# Patient Record
Sex: Female | Born: 1965
Health system: Southern US, Community
[De-identification: ages and names within clinical notes are randomized; demographics above are authoritative.]

## PROBLEM LIST (undated history)

## (undated) DIAGNOSIS — K219 Gastro-esophageal reflux disease without esophagitis: Secondary | ICD-10-CM

## (undated) HISTORY — PX: POLYPECTOMY: SHX149

## (undated) HISTORY — PX: COLONOSCOPY: SHX174

---

## 2004-11-13 ENCOUNTER — Ambulatory Visit (HOSPITAL_COMMUNITY): Admission: RE | Admit: 2004-11-13 | Discharge: 2004-11-13 | Payer: Self-pay | Admitting: Obstetrics and Gynecology

## 2010-05-30 ENCOUNTER — Other Ambulatory Visit (HOSPITAL_COMMUNITY): Payer: Self-pay | Admitting: Obstetrics and Gynecology

## 2010-05-30 DIAGNOSIS — Z1231 Encounter for screening mammogram for malignant neoplasm of breast: Secondary | ICD-10-CM

## 2010-06-10 ENCOUNTER — Ambulatory Visit (HOSPITAL_COMMUNITY)
Admission: RE | Admit: 2010-06-10 | Discharge: 2010-06-10 | Disposition: A | Discharge status: Home / Self Care | Payer: Commercial Managed Care - PPO | Source: Ambulatory Visit | Attending: Obstetrics and Gynecology | Admitting: Obstetrics and Gynecology

## 2010-06-10 ENCOUNTER — Other Ambulatory Visit: Payer: Self-pay | Admitting: Obstetrics and Gynecology

## 2010-06-10 DIAGNOSIS — N644 Mastodynia: Secondary | ICD-10-CM

## 2010-06-10 DIAGNOSIS — Z1231 Encounter for screening mammogram for malignant neoplasm of breast: Secondary | ICD-10-CM

## 2010-06-13 ENCOUNTER — Other Ambulatory Visit: Payer: Commercial Managed Care - PPO

## 2010-06-17 ENCOUNTER — Ambulatory Visit
Admission: RE | Admit: 2010-06-17 | Discharge: 2010-06-17 | Disposition: A | Discharge status: Home / Self Care | Payer: Commercial Managed Care - PPO | Source: Ambulatory Visit | Attending: Obstetrics and Gynecology | Admitting: Obstetrics and Gynecology

## 2010-06-17 DIAGNOSIS — N644 Mastodynia: Secondary | ICD-10-CM

## 2011-12-08 ENCOUNTER — Other Ambulatory Visit (HOSPITAL_COMMUNITY): Payer: Self-pay | Admitting: Obstetrics and Gynecology

## 2011-12-08 DIAGNOSIS — Z1231 Encounter for screening mammogram for malignant neoplasm of breast: Secondary | ICD-10-CM

## 2011-12-10 ENCOUNTER — Encounter: Payer: Self-pay | Admitting: Family Medicine

## 2011-12-10 ENCOUNTER — Ambulatory Visit (INDEPENDENT_AMBULATORY_CARE_PROVIDER_SITE_OTHER): Payer: Commercial Managed Care - PPO | Admitting: Family Medicine

## 2011-12-10 VITALS — BP 110/70 | Temp 98.2°F | Ht 62.0 in | Wt 193.0 lb

## 2011-12-10 DIAGNOSIS — Q393 Congenital stenosis and stricture of esophagus: Secondary | ICD-10-CM

## 2011-12-10 DIAGNOSIS — R5381 Other malaise: Secondary | ICD-10-CM

## 2011-12-10 DIAGNOSIS — Z8371 Family history of colonic polyps: Secondary | ICD-10-CM | POA: Insufficient documentation

## 2011-12-10 DIAGNOSIS — R5383 Other fatigue: Secondary | ICD-10-CM | POA: Insufficient documentation

## 2011-12-10 DIAGNOSIS — R413 Other amnesia: Secondary | ICD-10-CM | POA: Insufficient documentation

## 2011-12-10 DIAGNOSIS — Q391 Atresia of esophagus with tracheo-esophageal fistula: Secondary | ICD-10-CM

## 2011-12-10 DIAGNOSIS — K219 Gastro-esophageal reflux disease without esophagitis: Secondary | ICD-10-CM

## 2011-12-10 DIAGNOSIS — Q394 Esophageal web: Secondary | ICD-10-CM | POA: Insufficient documentation

## 2011-12-10 DIAGNOSIS — R109 Unspecified abdominal pain: Secondary | ICD-10-CM | POA: Insufficient documentation

## 2011-12-10 DIAGNOSIS — J301 Allergic rhinitis due to pollen: Secondary | ICD-10-CM | POA: Insufficient documentation

## 2011-12-10 LAB — BASIC METABOLIC PANEL
BUN: 11 mg/dL (ref 6–23)
Sodium: 136 mEq/L (ref 135–145)

## 2011-12-10 LAB — TSH: TSH: 1.45 u[IU]/mL (ref 0.35–5.50)

## 2011-12-10 LAB — LIPID PANEL
HDL: 49.3 mg/dL (ref >39.00)
LDL Cholesterol: 117 mg/dL — ABNORMAL HIGH (ref 0–99)

## 2011-12-10 LAB — CBC WITH DIFFERENTIAL/PLATELET
Basophils Absolute: 0 10*3/uL (ref 0.0–0.1)
Eosinophils Relative: 2.5 % (ref 0.0–5.0)
Hemoglobin: 13.2 g/dL (ref 12.0–15.0)
Lymphocytes Relative: 23.9 % (ref 12.0–46.0)
Lymphs Abs: 1.7 10*3/uL (ref 0.7–4.0)
MCHC: 33.1 g/dL (ref 30.0–36.0)
MCV: 92.1 fl (ref 78.0–100.0)
Monocytes Relative: 7.3 % (ref 3.0–12.0)
Platelets: 299 10*3/uL (ref 150.0–400.0)
RBC: 4.33 Mil/uL (ref 3.87–5.11)
WBC: 7 10*3/uL (ref 4.5–10.5)

## 2011-12-10 LAB — POCT URINALYSIS DIPSTICK
Bilirubin, UA: NEGATIVE
Leukocytes, UA: NEGATIVE
Protein, UA: NEGATIVE
Spec Grav, UA: 1.015
Urobilinogen, UA: 0.2

## 2011-12-10 LAB — HEPATIC FUNCTION PANEL
ALT: 15 U/L (ref 0–35)
AST: 18 U/L (ref 0–37)
Alkaline Phosphatase: 56 U/L (ref 39–117)
Bilirubin, Direct: 0.1 mg/dL (ref 0.0–0.3)
Total Bilirubin: 0.7 mg/dL (ref 0.3–1.2)
Total Protein: 7.2 g/dL (ref 6.0–8.3)

## 2011-12-10 LAB — T4, FREE: Free T4: 0.78 ng/dL (ref 0.60–1.60)

## 2011-12-10 LAB — T3, FREE: T3, Free: 2.5 pg/mL (ref 2.3–4.2)

## 2011-12-10 MED ORDER — ESOMEPRAZOLE MAGNESIUM 40 MG PO CPDR: 40.0000 mg | DELAYED_RELEASE_CAPSULE | Freq: Every day | ORAL | Status: DC

## 2011-12-10 NOTE — Patient Instructions (Signed)
Continue the Nexium  I will call you when I gets her lab work back  We will set you up for an ultrasound of your gallbladder  I would recommend you a colonoscopy before age 46 because of the positive family history of colon polyps

## 2011-12-10 NOTE — Progress Notes (Signed)
  Subjective:    Patient ID: Heidi Murphy, female    DOB: 1966/02/19, 46 y.o.   MRN: [id number]  HPI Heidi Murphy is a 46 year old married female nonsmoker G2 P2 who has a Mirena IUD which is checked by her GYN doctor ,,,,,,,,, who comes in today for evaluation of fatigue some memory changes weight gain mood changes  She states that she felt well until about 3 months ago when she began having symptoms of fatigue no energy she's gained about 7 pounds also some short-term memory changes. No family history of thyroid disease.Marland Kitchen She's also had symptoms of allergic rhinitis for which she's taken some over-the-counter Allegra which helped somewhat with the allergy symptoms of head congestion.  She's had a long history of reflux esophagitis and about 20 years ago had a barium swallow which documented an esophageal web. About a year ago her symptoms flared up and she went and saw a gastroenterologist who prescribed snack see him. If she takes it everyday which she usually does her symptoms are minimal however despite taking the Nexium she still has an occasional episode of difficulty swallowing. She also has intermittent episodes of midepigastric abdominal discomfort some of which she's describes as a sensation of bloating. They last for a day or 2 and go away. Her mother had gallbladder disease. Heidi Murphy has light skin and light eyes she's 46 has had 2 kids and a family history of gallbladder disease  Review of systems otherwise negative she does BSE monthly and gets annual mammography.  Her father's had a history of skin cancer hypertension hyperlipidemia elevated PSA and colon polyps. Her mother has had gallbladder disease reflux esophagitis and hiatal hernia. No sisters one brother in good health  Tetanus 2010   Review of Systems    total review of systems otherwise negative except for above Objective:   Physical Exam Well-developed well-nourished female in acute distress she has light skin and light eyes HEENT  were negative. Thyroid normal lungs are clear to auscultation cardiac exam normal abdominal exam normal skin exam normal  Neurologic exam she is oriented x3 cranial nerves II through XII are intact her sensation and strength reflexes gait all normal       Assessment & Plan:  Symptoms of fatigue with some memory changes and weight gain rule out thyroid disease check labs  Reflux esophagitis on Nexium we'll recommend a GI consult  Symptoms consistent with possible gallbladder disease set her up for an ultrasound of her gallbladder  Family history of colon polyps recommend she have a colonoscopy prior to age 33  Allergic rhinitis continue Allegra

## 2011-12-14 ENCOUNTER — Ambulatory Visit
Admission: RE | Admit: 2011-12-14 | Discharge: 2011-12-14 | Disposition: A | Discharge status: Home / Self Care | Payer: Commercial Managed Care - PPO | Source: Ambulatory Visit | Attending: Family Medicine | Admitting: Family Medicine

## 2011-12-14 DIAGNOSIS — R109 Unspecified abdominal pain: Secondary | ICD-10-CM

## 2011-12-18 ENCOUNTER — Ambulatory Visit (HOSPITAL_COMMUNITY)
Admission: RE | Admit: 2011-12-18 | Discharge: 2011-12-18 | Disposition: A | Discharge status: Home / Self Care | Payer: 59 | Source: Ambulatory Visit | Attending: Obstetrics and Gynecology | Admitting: Obstetrics and Gynecology

## 2011-12-18 DIAGNOSIS — Z1231 Encounter for screening mammogram for malignant neoplasm of breast: Secondary | ICD-10-CM | POA: Insufficient documentation

## 2012-07-19 ENCOUNTER — Other Ambulatory Visit: Payer: Self-pay | Admitting: Family Medicine

## 2012-07-19 DIAGNOSIS — R1032 Left lower quadrant pain: Secondary | ICD-10-CM

## 2012-07-26 ENCOUNTER — Encounter: Payer: Self-pay | Admitting: Internal Medicine

## 2012-08-16 ENCOUNTER — Encounter: Payer: Self-pay | Admitting: Internal Medicine

## 2012-08-19 ENCOUNTER — Encounter: Payer: Self-pay | Admitting: Internal Medicine

## 2012-08-19 ENCOUNTER — Ambulatory Visit (INDEPENDENT_AMBULATORY_CARE_PROVIDER_SITE_OTHER): Payer: 59 | Admitting: Internal Medicine

## 2012-08-19 ENCOUNTER — Other Ambulatory Visit (INDEPENDENT_AMBULATORY_CARE_PROVIDER_SITE_OTHER): Payer: 59

## 2012-08-19 VITALS — BP 112/78 | HR 64 | Ht 62.0 in | Wt 198.6 lb

## 2012-08-19 DIAGNOSIS — R194 Change in bowel habit: Secondary | ICD-10-CM

## 2012-08-19 DIAGNOSIS — R1314 Dysphagia, pharyngoesophageal phase: Secondary | ICD-10-CM

## 2012-08-19 DIAGNOSIS — R1310 Dysphagia, unspecified: Secondary | ICD-10-CM

## 2012-08-19 DIAGNOSIS — K219 Gastro-esophageal reflux disease without esophagitis: Secondary | ICD-10-CM

## 2012-08-19 DIAGNOSIS — R198 Other specified symptoms and signs involving the digestive system and abdomen: Secondary | ICD-10-CM

## 2012-08-19 DIAGNOSIS — K625 Hemorrhage of anus and rectum: Secondary | ICD-10-CM

## 2012-08-19 LAB — CBC
HCT: 39.2 % (ref 36.0–46.0)
Platelets: 280 10*3/uL (ref 150.0–400.0)
RBC: 4.45 Mil/uL (ref 3.87–5.11)
RDW: 14.3 % (ref 11.5–14.6)
WBC: 7.2 10*3/uL (ref 4.5–10.5)

## 2012-08-19 LAB — COMPREHENSIVE METABOLIC PANEL
BUN: 11 mg/dL (ref 6–23)
CO2: 28 mEq/L (ref 19–32)
Calcium: 8.9 mg/dL (ref 8.4–10.5)
GFR: 76.4 mL/min (ref >60.00)
Sodium: 139 mEq/L (ref 135–145)

## 2012-08-19 MED ORDER — PEG-KCL-NACL-NASULF-NA ASC-C 100 G PO SOLR: 1.0000 | Freq: Once | ORAL | Status: DC

## 2012-08-19 MED ORDER — ESOMEPRAZOLE MAGNESIUM 40 MG PO PACK: 40.0000 mg | PACK | Freq: Every day | ORAL | Status: DC

## 2012-08-19 NOTE — Patient Instructions (Addendum)
You have been scheduled for a colonoscopy/Endoscopy with propofol. Please follow written instructions given to you at your visit today.  Please pick up your prep kit at the pharmacy within the next 1-3 days. If you use inhalers (even only as needed), please bring them with you on the day of your procedure. Your physician has requested that you go to www.startemmi.com and enter the access code given to you at your visit today. This web site gives a general overview about your procedure. However, you should still follow specific instructions given to you by our office regarding your preparation for the procedure.  Your physician has requested that you go to the basement for the following lab work before leaving today: Celiac panel, CBC, CMP  We have sent the following medications to your pharmacy for you to pick up at your convenience: Nexium, moviprep, you were given instructions today at your office visit.                                               We are excited to introduce MyChart, a new best-in-class service that provides you online access to important information in your electronic medical record. We want to make it easier for you to view your health information - all in one secure location - when and where you need it. We expect MyChart will enhance the quality of care and service we provide.  When you register for MyChart, you can:    View your test results.    Request appointments and receive appointment reminders via email.    Request medication renewals.    View your medical history, allergies, medications and immunizations.    Communicate with your physician's office through a password-protected site.    Conveniently print information such as your medication lists.  To find out if MyChart is right for you, please talk to a member of our clinical staff today. We will gladly answer your questions about this free health and wellness tool.  If you are age 46 or older and want a  member of your family to have access to your record, you must provide written consent by completing a proxy form available at our office. Please speak to our clinical staff about guidelines regarding accounts for patients younger than age 41.  As you activate your MyChart account and need any technical assistance, please call the MyChart technical support line at [phone]) or email your question to [email or web address]. If you email your question(s), please include your name, a return phone number and the best time to reach you.  If you have non-urgent health-related questions, you can send a message to our office through MyChart at [email or web address]. If you have a medical emergency, call 911.  Thank you for using MyChart as your new health and wellness resource!   MyChart licensed from [employer],  4540-9811. Patents Pending.

## 2012-08-19 NOTE — Progress Notes (Signed)
Patient ID: Heidi Murphy, female   DOB: 01/26/1966, 47 y.o.   MRN: [id number] HPI: Heidi Murphy is a 47 year old female with a past medical history of GERD who is seen in consultation at the request of Dr. Tawanna Cooler for evaluation of chronic GERD, intermittent dysphagia, rectal bleeding and change in bowel habits. She is alone today. She reports 20 years of reflux disease which initially was intermittent. She recalls a remote barium swallow which revealed an esophageal wet. She has never had an endoscopy. She has taken PPI, initially with Nexium which seemed to help significantly. She also reports there are times when she was not taking acid suppressive medications, and was able to control her symptoms with portion control and dietary modification. Her GERD has been fairly consistent, but her dysphagia more intermittent. She recalls having esophageal dysphagia 1 year ago which seemed to respond and improve with PPI. She still reports intermittent difficulty swallowing particularly solid foods such as breads and meats. She recalls one occasion of a food impaction but she was able to vomit this bolus backup. No odynophagia. She reports intermittent epigastric pain. She also reports an ultrasound which was negative for gallbladder disease or stones. Over the last 6-12 months she's had a change in her bowel habits. She reports loose stools occasionally purulent liquid with mucus. She also is seeing dark red blood mixed with her stool 1 day per week. She reports her stools are more urgent now with some lower abdominal cramping. She reports a constant left lower quadrant pain which she describes as a "dull ache". She also endorses bloating and fullness. No fevers or chills. No mouth ulcers, no rashes, or new joint pains. No family history of IBD or celiac disease. Her father had colonic polyps. No weight loss in fact about 10 pound weight gain in the past 12 months. She has never had a colonoscopy.  She is currently taking  pantoprazole 40 mg on a consistent basis and rarely has breakthrough heartburn. She feels that Nexium worked considerably better for her reflux symptoms including dysphagia.  Patient Active Problem List   Diagnosis Date Noted  . Allergic rhinitis due to pollen 12/10/2011  . Intermittent memory loss 12/10/2011  . Esophageal web 12/10/2011  . GERD (gastroesophageal reflux disease) 12/10/2011  . Family history of colonic polyps 12/10/2011  . Abdominal pain 12/10/2011  . Fatigue 12/10/2011    Past Surgical History  Procedure Laterality Date  . Cesarean section      Current Outpatient Prescriptions  Medication Sig Dispense Refill  . esomeprazole (NEXIUM) 40 MG packet Take 40 mg by mouth daily before breakfast.  30 each  12  . peg 3350 powder (MOVIPREP) 100 G SOLR Take 1 kit (100 g total) by mouth once.  1 kit  0   No current facility-administered medications for this visit.    No Known Allergies  Family History  Problem Relation Age of Onset  . Prostate cancer Father   . Colon polyps Father   . GER disease Mother     GERD    History  Substance Use Topics  . Smoking status: Never Smoker   . Smokeless tobacco: Never Used  . Alcohol Use: No    ROS: As per history of present illness, otherwise negative  BP 112/78  Pulse 64  Ht 5\' 2"  (1.575 m)  Wt 198 lb 9.6 oz (90.084 kg)  BMI 36.32 kg/m2 Constitutional: Well-developed and well-nourished. No distress. HEENT: Normocephalic and atraumatic. Oropharynx is clear and moist. No  oropharyngeal exudate. Conjunctivae are normal.  No scleral icterus. Neck: Neck supple. Trachea midline. Cardiovascular: Normal rate, regular rhythm and intact distal pulses. No M/R/G Pulmonary/chest: Effort normal and breath sounds normal. No wheezing, rales or rhonchi. Abdominal: Soft, mild tenderness to deep palpation in the left midabdomen just lateral to the umbilicus, nondistended. Bowel sounds active throughout. There are no masses palpable. No  hepatosplenomegaly. Extremities: no clubbing, cyanosis, or edema Lymphadenopathy: No cervical adenopathy noted. Neurological: Alert and oriented to person place and time. Skin: Skin is warm and dry. No rashes noted. Psychiatric: Normal mood and affect. Behavior is normal.  RELEVANT LABS AND IMAGING: CBC    Component Value Date/Time   WBC 7.0 12/10/2011 1334   RBC 4.33 12/10/2011 1334   HGB 13.2 12/10/2011 1334   HCT 39.8 12/10/2011 1334   PLT 299.0 12/10/2011 1334   MCV 92.1 12/10/2011 1334   MCHC 33.1 12/10/2011 1334   RDW 13.7 12/10/2011 1334   LYMPHSABS 1.7 12/10/2011 1334   MONOABS 0.5 12/10/2011 1334   EOSABS 0.2 12/10/2011 1334   BASOSABS 0.0 12/10/2011 1334    CMP     Component Value Date/Time   NA 136 12/10/2011 1334   K 4.1 12/10/2011 1334   CL 102 12/10/2011 1334   CO2 28 12/10/2011 1334   GLUCOSE 97 12/10/2011 1334   BUN 11 12/10/2011 1334   CREATININE 0.8 12/10/2011 1334   CALCIUM 8.8 12/10/2011 1334   PROT 7.2 12/10/2011 1334   ALBUMIN 4.0 12/10/2011 1334   AST 18 12/10/2011 1334   ALT 15 12/10/2011 1334   ALKPHOS 56 12/10/2011 1334   BILITOT 0.7 12/10/2011 1334   LIMITED ABDOMINAL ULTRASOUND - RIGHT UPPER QUADRANT -- Sept 2013   Comparison:  None   Findings:   Gallbladder: No shadowing gallstones or echogenic sludge. No gallbladder wall thickening or pericholecystic fluid. Negative sonographic Murphy's sign according to the ultrasound technologist. Gallbladder wall thickness measured 2.3 mm.   CBD: Normal in caliber measuring approximately 3.7 mm.   Liver:  Normal size and echotexture without focal parenchymal abnormality.   Right Kidney:  No hydronephrosis.   IMPRESSION:   No right upper quadrant abdominal pathology is evident.  ASSESSMENT/PLAN:  47 year old female with a past medical history of GERD who is seen in consultation at the request of Dr. Tawanna Cooler for evaluation of chronic GERD, intermittent dysphagia, rectal bleeding and change in bowel habits.  1.   GERD/dysphagia -- patient has a long-standing history of gastroesophageal reflux disease symptoms and intermittent dysphagia. For this reason I have recommended direct visualization with upper endoscopy. This will help to exclude ongoing esophagitis, a significant ring/web, and eosinophilic esophagitis. For now I will switch her pantoprazole back to Nexium 40 mg daily. I would like to check a celiac panel today given her abdominal complaints. EGD will also be beneficial to visualize the small bowel mucosa.  2.  LLQ pain, change in bowel habits/rectal bleeding -- given the constant nature to her discomfort and benign abdominal exam today, diverticulitis is thought unlikely. I recommended proceeding to colonoscopy for further evaluation. This will help to exclude possible colonic source for bleeding including inflammatory bowel disease. She may benefit from a probiotic, but we will perform procedures before initiating any new therapy.  She has a pending appointment with her OB/GYN physician, which I think is also a great idea. I would like to check a CBC today to ensure there is no anemia with her intermittent bleeding.

## 2012-09-12 ENCOUNTER — Encounter: Payer: Self-pay | Admitting: Internal Medicine

## 2012-09-12 ENCOUNTER — Ambulatory Visit (AMBULATORY_SURGERY_CENTER): Payer: 59 | Admitting: Internal Medicine

## 2012-09-12 VITALS — BP 125/74 | HR 53 | Temp 98.6°F | Resp 14 | Ht 62.0 in | Wt 198.0 lb

## 2012-09-12 DIAGNOSIS — K219 Gastro-esophageal reflux disease without esophagitis: Secondary | ICD-10-CM

## 2012-09-12 DIAGNOSIS — R1314 Dysphagia, pharyngoesophageal phase: Secondary | ICD-10-CM

## 2012-09-12 DIAGNOSIS — K625 Hemorrhage of anus and rectum: Secondary | ICD-10-CM

## 2012-09-12 DIAGNOSIS — R109 Unspecified abdominal pain: Secondary | ICD-10-CM

## 2012-09-12 DIAGNOSIS — R194 Change in bowel habit: Secondary | ICD-10-CM

## 2012-09-12 DIAGNOSIS — R198 Other specified symptoms and signs involving the digestive system and abdomen: Secondary | ICD-10-CM

## 2012-09-12 DIAGNOSIS — R1310 Dysphagia, unspecified: Secondary | ICD-10-CM

## 2012-09-12 DIAGNOSIS — D126 Benign neoplasm of colon, unspecified: Secondary | ICD-10-CM

## 2012-09-12 MED ORDER — SODIUM CHLORIDE 0.9 % IV SOLN: 500.0000 mL | INTRAVENOUS | Status: DC

## 2012-09-12 NOTE — Patient Instructions (Addendum)
YOU HAD AN ENDOSCOPIC PROCEDURE TODAY AT THE [facility]: Refer to the procedure report that was given to you for any specific questions about what was found during the examination.  If the procedure report does not answer your questions, please call your gastroenterologist to clarify.  If you requested that your care partner not be given the details of your procedure findings, then the procedure report has been included in a sealed envelope for you to review at your convenience later.  YOU SHOULD EXPECT: Some feelings of bloating in the abdomen. Passage of more gas than usual.  Walking can help get rid of the air that was put into your GI tract during the procedure and reduce the bloating. If you had a lower endoscopy (such as a colonoscopy or flexible sigmoidoscopy) you may notice spotting of blood in your stool or on the toilet paper. If you underwent a bowel prep for your procedure, then you may not have a normal bowel movement for a few days.  DIET: Your first meal following the procedure should be a light meal and then it is ok to progress to your normal diet.  A half-sandwich or bowl of soup is an example of a good first meal.  Heavy or fried foods are harder to digest and may make you feel nauseous or bloated.  Likewise meals heavy in dairy and vegetables can cause extra gas to form and this can also increase the bloating.  Drink plenty of fluids but you should avoid alcoholic beverages for 24 hours.  ACTIVITY: Your care partner should take you home directly after the procedure.  You should plan to take it easy, moving slowly for the rest of the day.  You can resume normal activity the day after the procedure however you should NOT DRIVE or use heavy machinery for 24 hours (because of the sedation medicines used during the test).    SYMPTOMS TO REPORT IMMEDIATELY: A gastroenterologist can be reached at any hour.  During normal business hours, 8:30 AM to 5:00 PM Monday through Friday,  call [phone].  After hours and on weekends, please call the GI answering service at [phone] -emergency number-who will take a message and have the physician on call contact you.   Following lower endoscopy (colonoscopy or flexible sigmoidoscopy):  Excessive amounts of blood in the stool  Significant tenderness or worsening of abdominal pains  Swelling of the abdomen that is new, acute  Fever of 100F or higher  Following upper endoscopy (EGD)  Vomiting of blood or coffee ground material  New chest pain or pain under the shoulder blades  Painful or persistently difficult swallowing  New shortness of breath  Fever of 100F or higher  Black, tarry-looking stools  FOLLOW UP: If any biopsies were taken you will be contacted by phone or by letter within the next 1-3 weeks.  Call your gastroenterologist if you have not heard about the biopsies in 3 weeks.  Our staff will call the home number listed on your records the next business day following your procedure to check on you and address any questions or concerns that you may have at that time regarding the information given to you following your procedure. This is a courtesy call and so if there is no answer at the home number and we have not heard from you through the emergency physician on call, we will assume that you have returned to your regular daily activities without incident.  SIGNATURES/CONFIDENTIALITY: You and/or your  care partner have signed paperwork which will be entered into your electronic medical record.  These signatures attest to the fact that that the information above on your After Visit Summary has been reviewed and is understood.  Full responsibility of the confidentiality of this discharge information lies with you and/or your care-partner.   Handout on polyps, diverticulosis, high fiber diet, hemorrhoids  Hold aspirin, aspirin products, all anti inflammatory medicines like motrin, aleve, advil for 3-4  weeks. You may use tylenol only for pain and you may resume these medicines on Monday 10-10-12  You should expect to see some bleeding after this procedure.  Dr Rhea Belton would like you to have a flexible sigmoidoscopy in 3 months to check the rectal polypectomy site.  Continue your acid reflux medicine as directed daily. Please take this 20-30 minutes before the first meal of the day.

## 2012-09-12 NOTE — Op Note (Signed)
[facility] [address] [place], [address]   COLONOSCOPY PROCEDURE REPORT  PATIENT: Heidi, Murphy.  MR#: [id number] BIRTHDATE: [birth year] , 47  yrs. old GENDER: Female ENDOSCOPIST: Beverley Fiedler, MD REFERRED IO:[id number] Shawnie Dapper, M.D. PROCEDURE DATE:  09/12/2012 PROCEDURE:   Colonoscopy with snare polypectomy, Colonoscopy with cold biopsy polypectomy, and Submucosal injection, any substance  ASA CLASS:   Class II INDICATIONS:Change in bowel habits, Rectal Bleeding, and abdominal pain in the lower left quadrant. MEDICATIONS: MAC sedation, administered by CRNA and propofol (Diprivan) 500mg  IV  DESCRIPTION OF PROCEDURE:   After the risks benefits and alternatives of the procedure were thoroughly explained, informed consent was obtained.  A digital rectal exam revealed a palpable rectal polyp.   The LB [id number]  endoscope was introduced through the anus and advanced to the terminal ileum which was intubated for a short distance. No adverse events experienced. The quality of the prep was Moviprep fair  The instrument was then slowly withdrawn as the colon was fully examined.    COLON FINDINGS: The mucosa appeared normal in the terminal ileum. A sessile polyp measuring 2.5 cm in size was found in the rectum. Endoscopic mucosal resection was performed in a piecemeal fashion by injecting saline and indigo carmine into the submucosa to raise the lesion and polypectomy was performed with snare cautery.  The resection was incomplete and the remaining polyp tissue was removed with cold forceps.  There was no 'target sign' at EMR site.  There was minimal blood loss from the polypectomy which ceased spontaneously.   Three sessile polyps measuring 4-5 mm in size were found in the descending colon and sigmoid colon.  Polypectomy was performed using cold snare.  All resections were complete and all polyp tissue was completely retrieved.   There was mild  scattered diverticulosis noted in the descending colon and sigmoid colon. Retroflexed views revealed internal hemorrhoids. The time to cecum=4 minutes 13 seconds.  Withdrawal time=35 minutes 27 seconds. The scope was withdrawn and the procedure completed.  COMPLICATIONS: There were no complications.  ENDOSCOPIC IMPRESSION: 1.   Normal mucosa in the terminal ileum 2.   Sessile polyp measuring 2.5 cm in size was found in the rectum; endoscopic mucosal resection was performed 3.   Three sessile polyps measuring 4-5 mm in size were found in the descending colon and sigmoid colon; Polypectomy was performed using cold snare 4.   There was mild diverticulosis noted in the descending colon and sigmoid colon 5.   Small internal hemorrhoids  RECOMMENDATIONS: 1.  Hold aspirin, aspirin products, and anti-inflammatory medication for 2 weeks. 2.  Await pathology results 3.  Repeat examination with flexible sigmoidoscopy in 3 months to ensure complete resection (hospital setting given availability of APC) 4.  High fiber diet 5.  You will receive a letter within 1-2 weeks with the results of your biopsy as well as final recommendations.  Please call my office if you have not received a letter after 3 weeks.   eSigned:  Beverley Fiedler, MD 09/12/2012 2:45 PM   cc: The Patient and Roderick Pee, MD   PATIENT NAME:  Heidi, Murphy. MR#: [id number]

## 2012-09-12 NOTE — Progress Notes (Signed)
Patient did not experience any of the following events: a burn prior to discharge; a fall within the facility; wrong site/side/patient/procedure/implant event; or a hospital transfer or hospital admission upon discharge from the facility. (G8907)Patient did not have preoperative order for IV antibiotic SSI prophylaxis. (G8918) ewm 

## 2012-09-12 NOTE — Op Note (Signed)
[facility] [address] [place], [address]   ENDOSCOPY PROCEDURE REPORT  PATIENT: Heidi, Murphy.  MR#: [id number] BIRTHDATE: 04-Oct-1965 , 47  yrs. old GENDER: Female ENDOSCOPIST: Beverley Fiedler, MD REFERRED BY:  Roderick Pee, M.D. PROCEDURE DATE:  09/12/2012 PROCEDURE:  EGD w/ biopsy ASA CLASS:     Class II INDICATIONS:  history of GERD.   Dysphagia. MEDICATIONS: MAC sedation, administered by CRNA and propofol (Diprivan) 400mg  IV TOPICAL ANESTHETIC: none  DESCRIPTION OF PROCEDURE: After the risks benefits and alternatives of the procedure were thoroughly explained, informed consent was obtained.  The LB [id number] endoscope was introduced through the mouth and advanced to the second portion of the duodenum. Without limitations.  The instrument was slowly withdrawn as the mucosa was fully examined.   ESOPHAGUS: There was LA Class A (mild) reflux esophagitis noted. Multiple biopsies were taken in the distal and mid esophagus to rule out eosinophilic esophagitis.   A 3 cm hiatal hernia was noted.   There was no evidence of stricture  STOMACH: The mucosa of the stomach appeared normal.  Biopsies were taken in the antrum and angularis.  DUODENUM: The duodenal mucosa showed no abnormalities in the bulb and second portion of the duodenum.  Cold forceps biopsies were taken in the bulb and second portion.  Retroflexed views revealed a hiatal hernia.     The scope was then withdrawn from the patient and the procedure completed.  COMPLICATIONS: There were no complications.  ENDOSCOPIC IMPRESSION: 1.   There was LA Class A esophagitis noted 2.   Multiple biopsies were taken in the distal and mid esophagus to rule out eosinophilic esophagitis 3.   3 cm hiatal hernia 4.   The mucosa of the stomach appeared normal; biopsies were taken in the antrum and angularis 5.   The duodenal mucosa showed no abnormalities in the bulb and second portion of the  duodenum  RECOMMENDATIONS: 1.  Await pathology results 2.  Continue taking your PPI (antiacid medicine) once daily.  It is best to be taken 20-30 minutes prior to breakfast meal.  eSigned:  Beverley Fiedler, MD 09/12/2012 2:49 PM          CC:The Patient and Roderick Pee, MD

## 2012-09-12 NOTE — Progress Notes (Signed)
Called to room to assist during endoscopic procedure.  Patient ID and intended procedure confirmed with present staff. Received instructions for my participation in the procedure from the performing physician.  

## 2012-09-13 ENCOUNTER — Telehealth: Payer: Self-pay

## 2012-09-13 NOTE — Telephone Encounter (Signed)
  Follow up Call-  Call back number 09/12/2012  Post procedure Call Back phone  # [phone]  Permission to leave phone message Yes     Patient questions:  Do you have a fever, pain , or abdominal swelling? no Pain Score  0 *  Have you tolerated food without any problems? yes  Have you been able to return to your normal activities? yes  Do you have any questions about your discharge instructions: Diet   no Medications  no Follow up visit  no  Do you have questions or concerns about your Care? no  Actions: * If pain score is 4 or above: No action needed, pain <4.

## 2012-09-20 ENCOUNTER — Encounter: Payer: Self-pay | Admitting: Internal Medicine

## 2012-09-20 ENCOUNTER — Other Ambulatory Visit: Payer: Self-pay | Admitting: *Deleted

## 2012-09-20 ENCOUNTER — Telehealth: Payer: Self-pay | Admitting: *Deleted

## 2012-09-20 DIAGNOSIS — Z8719 Personal history of other diseases of the digestive system: Secondary | ICD-10-CM

## 2012-09-20 NOTE — Telephone Encounter (Signed)
Per Dr Rhea Belton, informed pt that her path came back showing precancerous polyps and the large rectal polyp was also precancerous, but no high grade dysplasia. The rectal polyp's architecture had a villous appearance; this type has a higher risk of cancer. The rectal polyp was removed by piecemeal, so she will need a repeat procedure to recheck the area in 3 months. Dr Rhea Belton will need to do this at the hospital in case special equipment is needed. Her EGD path showed nothing significant other there inflammation. Pt stated understanding. She is having upper GI problems, but has started a probiotic and metamucil and will let us know in a couple of weeks if ineffective.

## 2012-09-20 NOTE — Telephone Encounter (Signed)
Pt has been scheduled for 12/13/12 at 09:30am for her Flex Sig at [facility]. Mailed pt prep instructions requesting her to call for questions.

## 2012-11-14 ENCOUNTER — Encounter (HOSPITAL_COMMUNITY): Payer: Self-pay | Admitting: Pharmacy Technician

## 2012-11-15 ENCOUNTER — Encounter (HOSPITAL_COMMUNITY): Payer: Self-pay | Admitting: *Deleted

## 2012-12-13 ENCOUNTER — Encounter (HOSPITAL_COMMUNITY)
Admission: RE | Disposition: A | Discharge status: Home / Self Care | Payer: Self-pay | Source: Ambulatory Visit | Attending: Internal Medicine

## 2012-12-13 ENCOUNTER — Ambulatory Visit (HOSPITAL_COMMUNITY): Payer: 59 | Admitting: Anesthesiology

## 2012-12-13 ENCOUNTER — Encounter (HOSPITAL_COMMUNITY): Payer: Self-pay | Admitting: *Deleted

## 2012-12-13 ENCOUNTER — Ambulatory Visit (HOSPITAL_COMMUNITY)
Admission: RE | Admit: 2012-12-13 | Discharge: 2012-12-13 | Disposition: A | Discharge status: Home / Self Care | Payer: 59 | Source: Ambulatory Visit | Attending: Internal Medicine | Admitting: Internal Medicine

## 2012-12-13 ENCOUNTER — Encounter (HOSPITAL_COMMUNITY): Payer: Self-pay | Admitting: Anesthesiology

## 2012-12-13 DIAGNOSIS — Z9889 Other specified postprocedural states: Secondary | ICD-10-CM

## 2012-12-13 DIAGNOSIS — Z8719 Personal history of other diseases of the digestive system: Secondary | ICD-10-CM

## 2012-12-13 DIAGNOSIS — Z860100 Personal history of colon polyps, unspecified: Secondary | ICD-10-CM | POA: Insufficient documentation

## 2012-12-13 DIAGNOSIS — Z860101 Personal history of adenomatous and serrated colon polyps: Secondary | ICD-10-CM

## 2012-12-13 DIAGNOSIS — K625 Hemorrhage of anus and rectum: Secondary | ICD-10-CM | POA: Insufficient documentation

## 2012-12-13 DIAGNOSIS — D128 Benign neoplasm of rectum: Secondary | ICD-10-CM | POA: Insufficient documentation

## 2012-12-13 DIAGNOSIS — D126 Benign neoplasm of colon, unspecified: Secondary | ICD-10-CM

## 2012-12-13 DIAGNOSIS — K219 Gastro-esophageal reflux disease without esophagitis: Secondary | ICD-10-CM | POA: Insufficient documentation

## 2012-12-13 DIAGNOSIS — R198 Other specified symptoms and signs involving the digestive system and abdomen: Secondary | ICD-10-CM | POA: Insufficient documentation

## 2012-12-13 HISTORY — DX: Gastro-esophageal reflux disease without esophagitis: K21.9

## 2012-12-13 HISTORY — PX: FLEXIBLE SIGMOIDOSCOPY: SHX5431

## 2012-12-13 HISTORY — PX: HOT HEMOSTASIS: SHX5433

## 2012-12-13 SURGERY — SIGMOIDOSCOPY, FLEXIBLE
Anesthesia: Monitor Anesthesia Care

## 2012-12-13 MED ORDER — LACTATED RINGERS IV SOLN
INTRAVENOUS | Status: DC
  Administered 2012-12-13: 1000 mL via INTRAVENOUS

## 2012-12-13 MED ORDER — SODIUM CHLORIDE 0.9 % IV SOLN: INTRAVENOUS | Status: DC

## 2012-12-13 MED ORDER — PROPOFOL INFUSION 10 MG/ML OPTIME
INTRAVENOUS | Status: DC | PRN
  Administered 2012-12-13: 120 ug/kg/min via INTRAVENOUS

## 2012-12-13 MED ORDER — FENTANYL CITRATE 0.05 MG/ML IJ SOLN: 25.0000 ug | INTRAMUSCULAR | Status: DC | PRN

## 2012-12-13 MED ORDER — KETAMINE HCL 10 MG/ML IJ SOLN
INTRAMUSCULAR | Status: DC | PRN
  Administered 2012-12-13: 10 mg via INTRAVENOUS
  Administered 2012-12-13: 20 mg via INTRAVENOUS
  Administered 2012-12-13: 10 mg via INTRAVENOUS

## 2012-12-13 MED ORDER — MIDAZOLAM HCL 5 MG/5ML IJ SOLN
INTRAMUSCULAR | Status: DC | PRN
  Administered 2012-12-13: 2 mg via INTRAVENOUS

## 2012-12-13 NOTE — Transfer of Care (Signed)
Immediate Anesthesia Transfer of Care Note  Patient: Heidi Murphy  Procedure(s) Performed: Procedure(s): FLEXIBLE SIGMOIDOSCOPY (N/A) HOT HEMOSTASIS (ARGON PLASMA COAGULATION/BICAP) (N/A)  Patient Location: PACU and Endoscopy Unit  Anesthesia Type:MAC  Level of Consciousness: awake, alert , oriented and patient cooperative  Airway & Oxygen Therapy: Patient Spontanous Breathing and Patient connected to face mask oxygen  Post-op Assessment: Report given to PACU RN, Post -op Vital signs reviewed and stable and Patient moving all extremities X 4  Post vital signs: Reviewed and stable  Complications: No apparent anesthesia complications

## 2012-12-13 NOTE — Anesthesia Preprocedure Evaluation (Addendum)
Anesthesia Evaluation  Patient identified by MRN, date of birth, ID band Patient awake    Reviewed: Allergy & Precautions, H&P , NPO status , Patient's Chart, lab work & pertinent test results  Airway Mallampati: II TM Distance: >3 FB Neck ROM: full    Dental  (+) Caps and Dental Advisory Given 2 upper front are capped:   Pulmonary neg pulmonary ROS,  breath sounds clear to auscultation  Pulmonary exam normal       Cardiovascular Exercise Tolerance: Good negative cardio ROS  Rhythm:regular Rate:Normal     Neuro/Psych negative neurological ROS  negative psych ROS   GI/Hepatic negative GI ROS, Neg liver ROS, GERD-  Medicated and Controlled,  Endo/Other  negative endocrine ROS  Renal/GU negative Renal ROS  negative genitourinary   Musculoskeletal   Abdominal   Peds  Hematology negative hematology ROS (+)   Anesthesia Other Findings   Reproductive/Obstetrics negative OB ROS                          Anesthesia Physical Anesthesia Plan  ASA: II  Anesthesia Plan: MAC   Post-op Pain Management:    Induction:   Airway Management Planned:   Additional Equipment:   Intra-op Plan:   Post-operative Plan:   Informed Consent: I have reviewed the patients History and Physical, chart, labs and discussed the procedure including the risks, benefits and alternatives for the proposed anesthesia with the patient or authorized representative who has indicated his/her understanding and acceptance.   Dental Advisory Given  Plan Discussed with: CRNA and Surgeon  Anesthesia Plan Comments:         Anesthesia Quick Evaluation

## 2012-12-13 NOTE — Op Note (Addendum)
[facility] [address] [place], [address]   FLEX SIGMOIDOSCOPY PROCEDURE REPORT  PATIENT: Heidi Murphy, Heidi Murphy.  MR#: [id number] BIRTHDATE: 10/04/1965 , 47  yrs. old GENDER: Female ENDOSCOPIST: Beverley Fiedler, MD PROCEDURE DATE:  12/13/2012 PROCEDURE:   Sigmoidoscopy with biopsy and Sigmoidoscopy with cold snare and cold biopsy polypectomy INDICATIONS:high risk patient with personal history of colonic polyps, piecemeal resection of rectal polyp June 2014. MEDICATIONS: MAC sedation, administered by CRNA and See Anesthesia Report.  DESCRIPTION OF PROCEDURE:    Physical exam was performed.  Informed consent was obtained from the patient after explaining the benefits, risks, and alternatives to procedure.  The patient was connected to monitor and placed in left lateral position. Continuous oxygen was provided by nasal cannula and IV medicine administered through an indwelling cannula.  After administration of sedation and rectal exam, the patients rectum was intubated and the Pentax peds colonoscope [id number] was advanced under direct visualization to the mid sigmoid colon.  The scope was removed slowly by carefully examining the color, texture, anatomy, and integrity mucosa on the way out.  The patient was recovered in endoscopy and discharged home in satisfactory condition.    COLON FINDINGS: A sessile polyp measuring 5 mm in size was found in the rectum. This is felt to be residual polyp tissue from previously resected rectal polyp.  A polypectomy was performed using snare cautery.  The resection was complete and the polyp tissue was completely retrieved.  Multiple biopsies around the polypectomy site were performed using cold forceps to exclude adenomatous change.  A sessile polyp measuring 2 mm in size was found in the distal sigmoid colon.  A polypectomy was performed with cold forceps.  The resection was complete and the polyp tissue was completely retrieved.   Mucosa otherwise normal in examined segment.  PREP QUALITY: good COMPLICATIONS: None  ENDOSCOPIC IMPRESSION: 1.   Sessile polyp measuring 5 mm in size was found in the rectum (most likely residual polyp); polypectomy was performed using snare cautery; multiple biopsies around polypectomy base were performed using cold forceps 2.   Sessile polyp measuring 2 mm in size was found in the distal sigmoid colon; polypectomy was performed with cold forceps 3.   The colon mucosa was otherwise normal in the examined sigmoid and rectum   RECOMMENDATIONS: 1.  Await biopsy results 2.  Repeat surveillance interval to be determined by pathology results  CC: Dr. Kelle Darting, the patient eSigned:  Beverley Fiedler, MD 12/13/2012 10:27 AM  Revised: 12/13/2012 10:27 AM

## 2012-12-13 NOTE — Anesthesia Postprocedure Evaluation (Signed)
  Anesthesia Post-op Note  Patient: Heidi Murphy  Procedure(s) Performed: Procedure(s) (LRB): FLEXIBLE SIGMOIDOSCOPY (N/A) HOT HEMOSTASIS (ARGON PLASMA COAGULATION/BICAP) (N/A)  Patient Location: PACU  Anesthesia Type: MAC  Level of Consciousness: awake and alert   Airway and Oxygen Therapy: Patient Spontanous Breathing  Post-op Pain: mild  Post-op Assessment: Post-op Vital signs reviewed, Patient's Cardiovascular Status Stable, Respiratory Function Stable, Patent Airway and No signs of Nausea or vomiting  Last Vitals:  Filed Vitals:   12/13/12 1022  BP:   Pulse:   Temp: 36.5 C  Resp:     Post-op Vital Signs: stable   Complications: No apparent anesthesia complications

## 2012-12-13 NOTE — H&P (Signed)
HPI: 47 year old female with a past medical history of GERD who was seen in May 2014 to evaluate chronic GERD, rectal bleeding and change in bowel habits. She had a colonoscopy on 09/12/2012 which revealed a 2.5 cm sessile rectal polyp. Endoscopic mucosal resection was performed in piecemeal fashion. She also had 3 other adenomatous polyps which were removed from the descending and sigmoid colon.  A repeat flexible sigmoidoscopy was recommended in 3 months to ensure complete resection of her large rectal polyp. She returns today for this her seizure. She reports she is doing well without specific complaint. She denies rectal bleeding.  She is using Nexium and a probiotic daily which she reports has helped her previous UGI complaints.  Past Medical History  Diagnosis Date  . GERD (gastroesophageal reflux disease)     Past Surgical History  Procedure Laterality Date  . Cesarean section  2001 AND 2003    Current Facility-Administered Medications  Medication Dose Route Frequency Provider Last Rate Last Dose  . 0.9 %  sodium chloride infusion   Intravenous Continuous Beverley Fiedler, MD      . fentaNYL (SUBLIMAZE) injection 25-50 mcg  25-50 mcg Intravenous Q5 min PRN Gaetano Hawthorne, MD      . lactated ringers infusion   Intravenous Continuous Gaetano Hawthorne, MD 125 mL/hr at 12/13/12 0925 1,000 mL at 12/13/12 0925    No Known Allergies  Family History  Problem Relation Age of Onset  . Prostate cancer Father   . Colon polyps Father   . GER disease Mother     GERD    History  Substance Use Topics  . Smoking status: Never Smoker   . Smokeless tobacco: Never Used  . Alcohol Use: No    ROS: As per history of present illness, otherwise negative  BP 109/68  Pulse 62  Temp(Src) 97.9 F (36.6 C) (Oral)  Resp 19  Ht 5\' 2"  (1.575 m)  Wt 198 lb (89.812 kg)  BMI 36.21 kg/m2  SpO2 98% Gen: awake, alert, NAD HEENT: anicteric, op clear CV: RRR, no mrg Pulm: CTA b/l Abd: soft, NT/ND, +BS  throughout Ext: no c/c/e Neuro: nonfocal   RELEVANT LABS AND IMAGING: CBC    Component Value Date/Time   WBC 7.2 08/19/2012 1419   RBC 4.45 08/19/2012 1419   HGB 13.3 08/19/2012 1419   HCT 39.2 08/19/2012 1419   PLT 280.0 08/19/2012 1419   MCV 87.9 08/19/2012 1419   MCHC 34.0 08/19/2012 1419   RDW 14.3 08/19/2012 1419   LYMPHSABS 1.7 12/10/2011 1334   MONOABS 0.5 12/10/2011 1334   EOSABS 0.2 12/10/2011 1334   BASOSABS 0.0 12/10/2011 1334    CMP     Component Value Date/Time   NA 139 08/19/2012 1419   K 4.3 08/19/2012 1419   CL 106 08/19/2012 1419   CO2 28 08/19/2012 1419   GLUCOSE 95 08/19/2012 1419   BUN 11 08/19/2012 1419   CREATININE 0.9 08/19/2012 1419   CALCIUM 8.9 08/19/2012 1419   PROT 7.2 08/19/2012 1419   ALBUMIN 3.8 08/19/2012 1419   AST 16 08/19/2012 1419   ALT 17 08/19/2012 1419   ALKPHOS 56 08/19/2012 1419   BILITOT 0.7 08/19/2012 1419    ASSESSMENT/PLAN: 47 year old with adenomatous colon polyps, one 2.5 cm rectal polyp removed in piecemeal fashion in June 2014 he returns for flexible sigmoidoscopy to ensure complete resection  1.  Adenomatous rectal polyp -- she is here today for a flexible sigmoidoscopy to evaluate the polypectomy  site in the rectum. If residual polyp remains, we will again attempt to remove it. The nature of the procedure, as well as the risks, benefits, and alternatives were carefully and thoroughly reviewed with the patient. Ample time for discussion and questions allowed. The patient understood, was satisfied, and agreed to proceed. Surveillance interval to be determined by today's exam

## 2012-12-14 ENCOUNTER — Encounter (HOSPITAL_COMMUNITY): Payer: Self-pay | Admitting: Internal Medicine

## 2012-12-15 ENCOUNTER — Encounter: Payer: Self-pay | Admitting: Internal Medicine

## 2013-01-20 ENCOUNTER — Telehealth: Payer: Self-pay | Admitting: *Deleted

## 2013-01-20 ENCOUNTER — Other Ambulatory Visit: Payer: Self-pay | Admitting: Family Medicine

## 2013-01-20 DIAGNOSIS — L989 Disorder of the skin and subcutaneous tissue, unspecified: Secondary | ICD-10-CM

## 2013-01-20 NOTE — Telephone Encounter (Signed)
Patient has a spot on her check that should be evaluated by a dermatologist

## 2013-04-06 ENCOUNTER — Other Ambulatory Visit: Payer: Self-pay | Admitting: *Deleted

## 2013-04-06 MED ORDER — PEN NEEDLES 31G X 6 MM MISC: 1.0000 | Freq: Every day | Status: DC

## 2013-09-19 ENCOUNTER — Other Ambulatory Visit: Payer: Self-pay | Admitting: Internal Medicine

## 2013-11-20 ENCOUNTER — Ambulatory Visit (INDEPENDENT_AMBULATORY_CARE_PROVIDER_SITE_OTHER): Payer: 59 | Admitting: Podiatry

## 2013-11-20 ENCOUNTER — Encounter: Payer: Self-pay | Admitting: Podiatry

## 2013-11-20 VITALS — BP 123/74 | HR 70 | Resp 12

## 2013-11-20 DIAGNOSIS — B351 Tinea unguium: Secondary | ICD-10-CM

## 2013-11-20 DIAGNOSIS — L600 Ingrowing nail: Secondary | ICD-10-CM

## 2013-11-20 NOTE — Progress Notes (Signed)
   Subjective:    Patient ID: Heidi Murphy, female    DOB: Sep 08, 1965, 48 y.o.   MRN: [id number]  HPI RT FOOT GREAT TOENAIL HAVE DISCOLORATION, THIN FOR 3 YEARS. THE TOENAIL LOOK WORSE. THE TOENAIL DOES NOT HURT. TRIED TAKE LAMISIL IT HELP SOME.   Review of Systems  All other systems reviewed and are negative.      Objective:   Physical Exam        Assessment & Plan:

## 2013-11-20 NOTE — Patient Instructions (Signed)

## 2013-11-21 NOTE — Progress Notes (Signed)
Subjective:     Patient ID: Heidi Murphy, female   DOB: 1965/11/08, 48 y.o.   MRN: [id number]  Toe Pain    patient presents stating that the right big toenail has been giving her trouble and has been damage to the last 3 years and is loose and at times painful   Review of Systems  All other systems reviewed and are negative.      Objective:   Physical Exam  Nursing note and vitals reviewed. Constitutional: She is oriented to person, place, and time.  Cardiovascular: Intact distal pulses.   Musculoskeletal: Normal range of motion.  Neurological: She is oriented to person, place, and time.  Skin: Skin is warm.   neurovascular status intact with muscle strength adequate and range of motion subtalar and midtarsal joint within normal limits. Patient is noted to have a severely damaged right big toenail that is loose and lifted and does not appear to have any adherence to underlying tissue. Digits are well-perfused and arch height was normal and patient is well oriented x3     Assessment:     Damage right hallux nail of 3 year duration secondary to trauma    Plan:     Reviewed condition and at this time recommended removal and we discussed permanent procedure versus temporary. We're going to do a temporary procedure see the results and decide whether or not long-term it'll require permanent removal which she understands completely. Infiltrated 60 mg I can Marcaine mixture remove the hallux nail creating a clean bed and will allow a new nail to regrow

## 2014-04-21 IMAGING — MG MM DIGITAL SCREENING BILAT
4 series · 4 of 4 positions shown · non-contrast
Comparison: Previous exams.

CLINICAL DATA: Screening.

DIGITAL BILATERAL SCREENING MAMMOGRAM WITH CAD

[R CC]
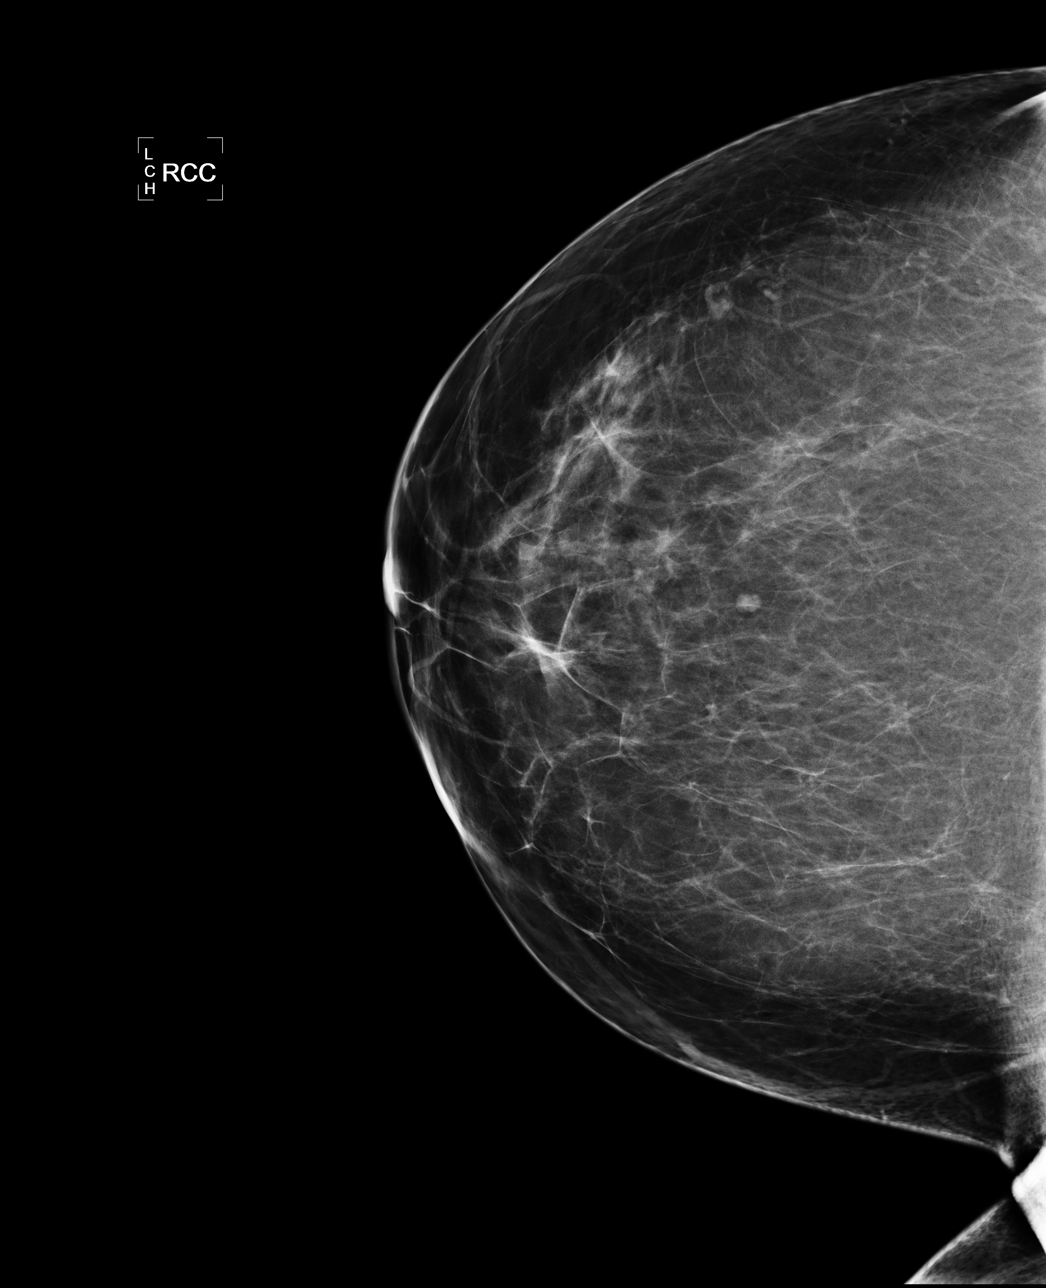

[R MLO]
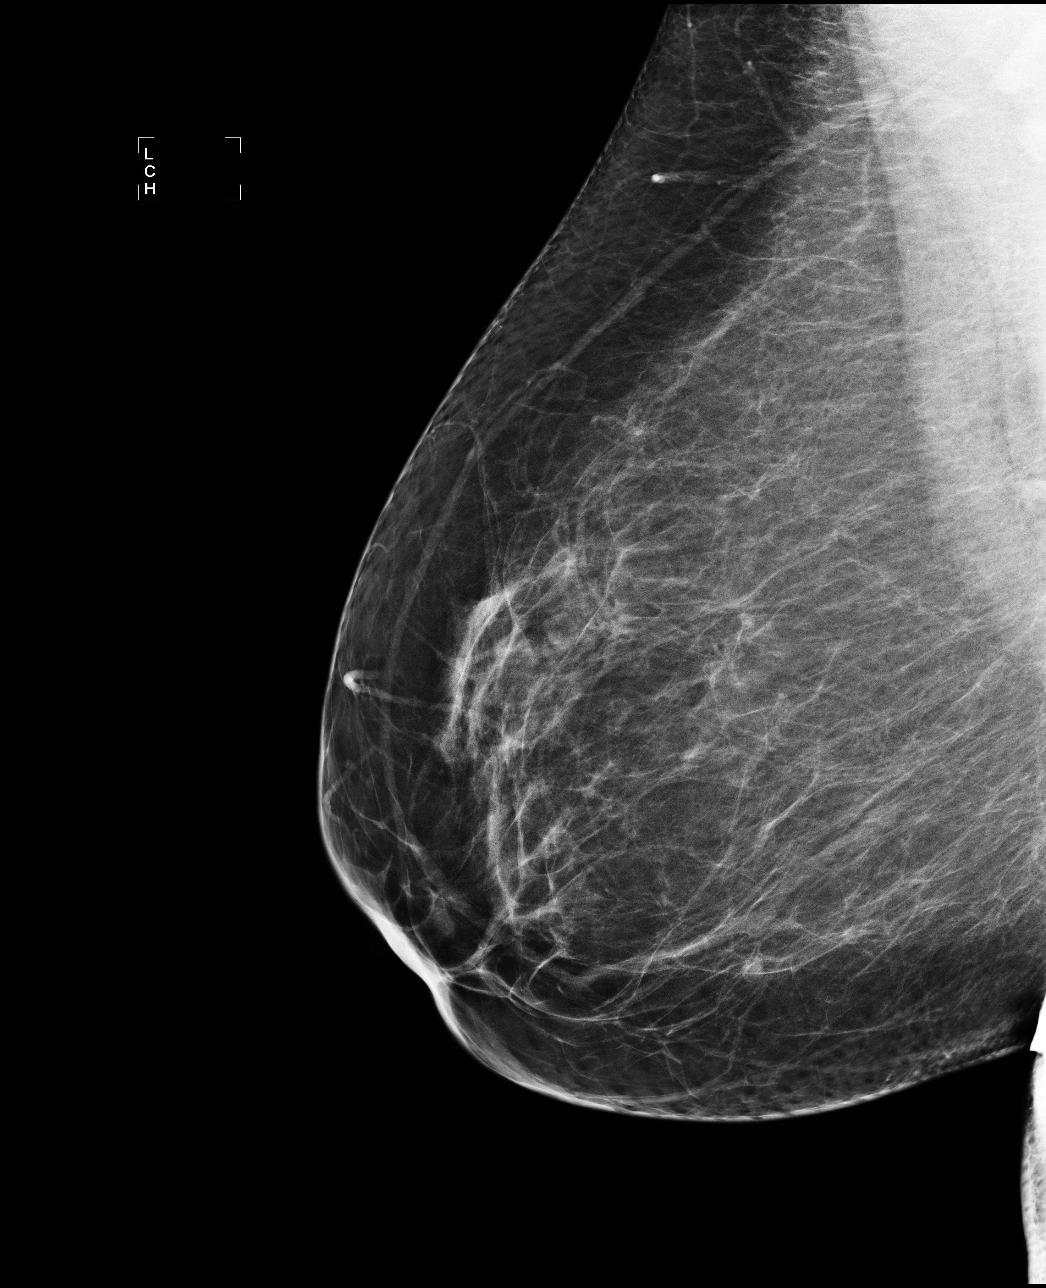

[L CC]
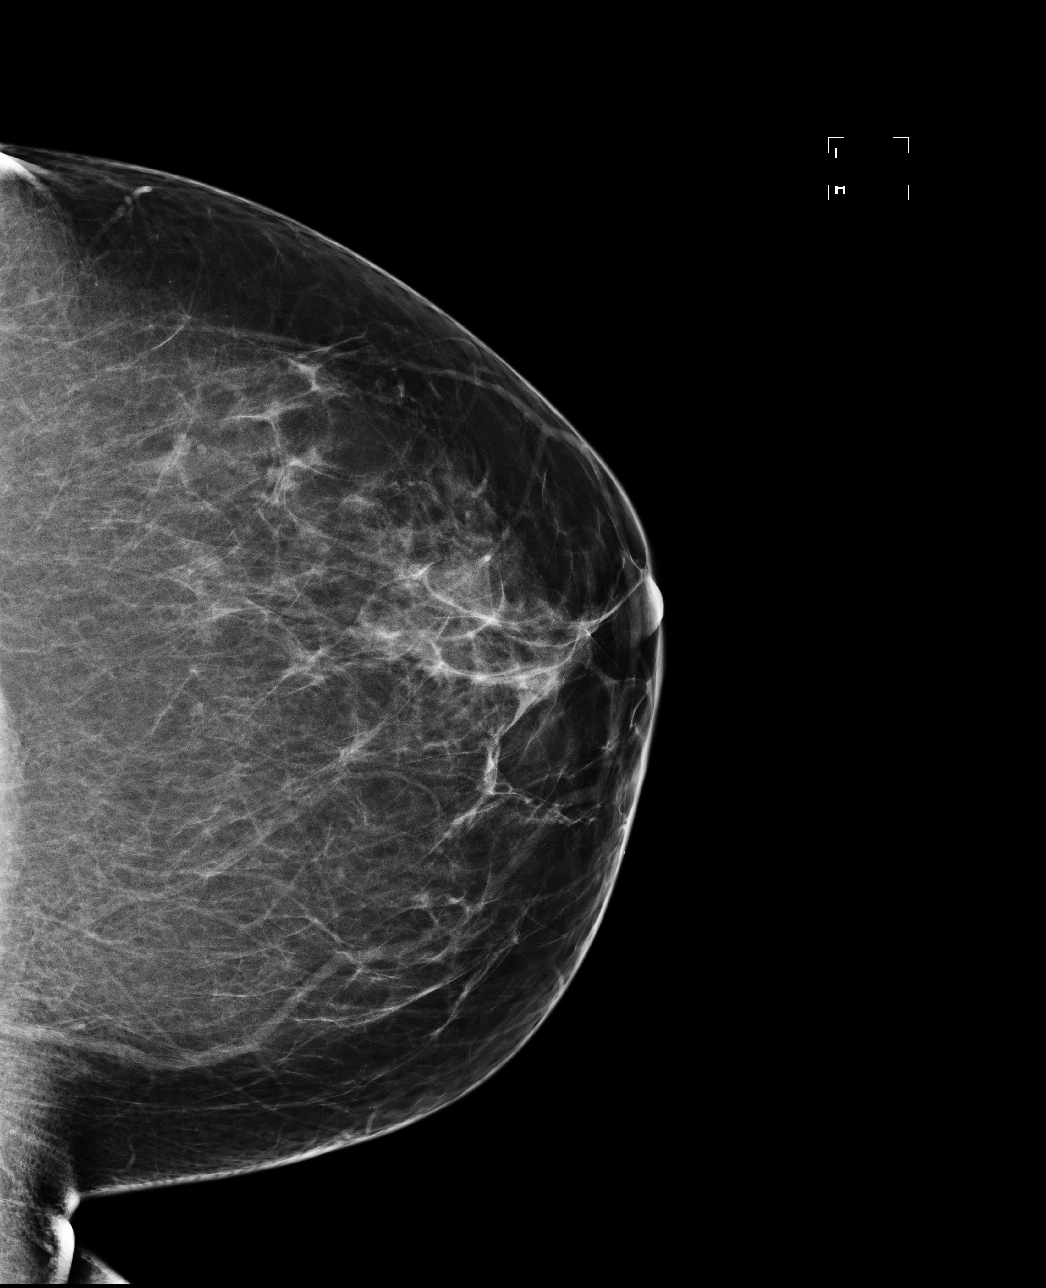

[L MLO]
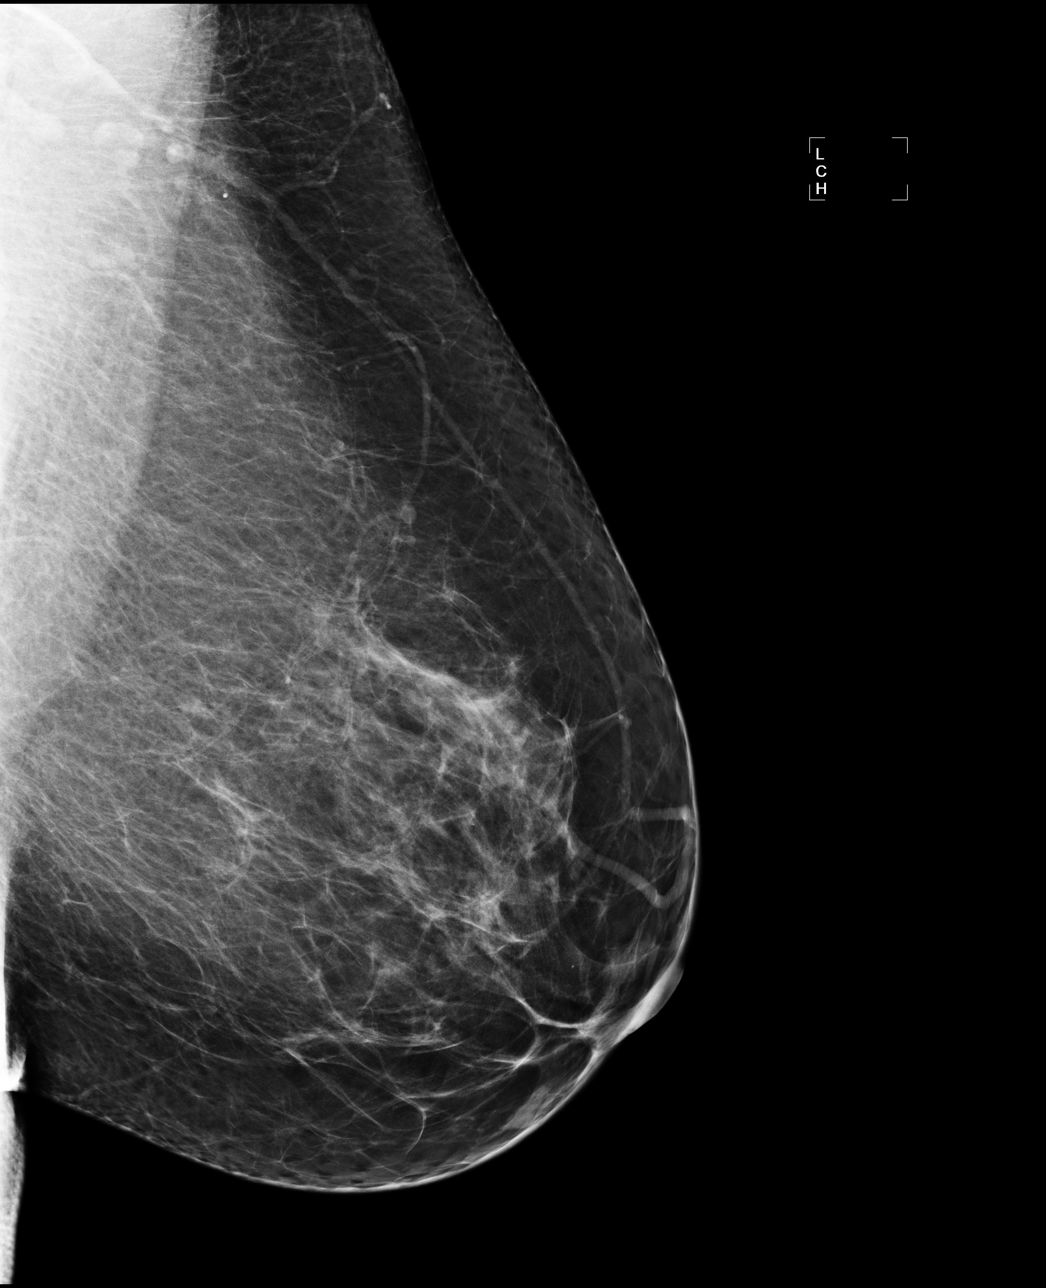

[4 of 4 positions shown; findings below may reference images not displayed]

FINDINGS: There are scattered fibroglandular densities. No
suspicious masses, architectural distortion, or calcifications are
present.

Images were processed with CAD.
IMPRESSION: No mammographic evidence of malignancy.

A result letter of this screening mammogram will be mailed directly
to the patient.

RECOMMENDATION:
Screening mammogram in one year. (Code:FV-[id number])

BI-RADS CATEGORY 1:  Negative.

## 2014-10-17 ENCOUNTER — Other Ambulatory Visit: Payer: Self-pay | Admitting: Internal Medicine

## 2014-10-23 ENCOUNTER — Other Ambulatory Visit: Payer: Self-pay | Admitting: *Deleted

## 2014-10-23 MED ORDER — ESOMEPRAZOLE MAGNESIUM 40 MG PO CPDR: DELAYED_RELEASE_CAPSULE | ORAL | Status: DC

## 2015-06-05 MED FILL — ESOMEPRAZOLE MAG DR 40 MG C: 40 | 90 days supply | Qty: 90 | Fill #2

## 2015-08-30 MED FILL — ESOMEPRAZOLE MAG DR 40 MG C: 40 | 90 days supply | Qty: 90 | Fill #3

## 2015-10-25 ENCOUNTER — Encounter: Payer: Self-pay | Admitting: Internal Medicine

## 2015-11-26 ENCOUNTER — Other Ambulatory Visit: Payer: Self-pay | Admitting: Family Medicine

## 2015-11-26 MED FILL — ESOMEPRAZOLE MAG DR 40 MG C: 40 | 90 days supply | Qty: 90 | Fill #0

## 2016-01-23 ENCOUNTER — Encounter: Payer: Self-pay | Admitting: Internal Medicine

## 2016-02-18 MED FILL — ESOMEPRAZOLE MAG DR 40 MG C: 40 | 90 days supply | Qty: 90 | Fill #1

## 2016-02-27 ENCOUNTER — Ambulatory Visit (AMBULATORY_SURGERY_CENTER): Payer: Self-pay | Admitting: *Deleted

## 2016-02-27 VITALS — Ht 62.0 in | Wt 214.0 lb

## 2016-02-27 DIAGNOSIS — Z86010 Personal history of colonic polyps: Secondary | ICD-10-CM

## 2016-02-27 MED ORDER — NA SULFATE-K SULFATE-MG SULF 17.5-3.13-1.6 GM/177ML PO SOLN: 1.0000 | Freq: Once | ORAL | 0 refills | Status: AC

## 2016-02-27 MED FILL — SUPREP BOWEL PREP KIT: 17.5-3.13-1 | 1 days supply | Qty: 354 | Fill #0

## 2016-02-27 NOTE — Progress Notes (Signed)
No egg or soy allergy. No anesthesia problems.  No home O2.  No diet meds.  

## 2016-03-05 ENCOUNTER — Ambulatory Visit (AMBULATORY_SURGERY_CENTER): Payer: 59 | Admitting: Internal Medicine

## 2016-03-05 ENCOUNTER — Encounter: Payer: Self-pay | Admitting: Internal Medicine

## 2016-03-05 VITALS — BP 122/70 | HR 70 | Temp 98.4°F | Resp 18 | Ht 62.0 in | Wt 214.0 lb

## 2016-03-05 DIAGNOSIS — D128 Benign neoplasm of rectum: Secondary | ICD-10-CM | POA: Diagnosis not present

## 2016-03-05 DIAGNOSIS — D124 Benign neoplasm of descending colon: Secondary | ICD-10-CM | POA: Diagnosis not present

## 2016-03-05 DIAGNOSIS — Z86010 Personal history of colonic polyps: Secondary | ICD-10-CM | POA: Diagnosis present

## 2016-03-05 DIAGNOSIS — K635 Polyp of colon: Secondary | ICD-10-CM

## 2016-03-05 DIAGNOSIS — D127 Benign neoplasm of rectosigmoid junction: Secondary | ICD-10-CM

## 2016-03-05 DIAGNOSIS — Z1211 Encounter for screening for malignant neoplasm of colon: Secondary | ICD-10-CM | POA: Diagnosis not present

## 2016-03-05 DIAGNOSIS — K219 Gastro-esophageal reflux disease without esophagitis: Secondary | ICD-10-CM | POA: Diagnosis not present

## 2016-03-05 MED ORDER — SODIUM CHLORIDE 0.9 % IV SOLN
500.0000 mL | INTRAVENOUS | Status: DC
Start: 2016-03-05 — End: 2016-08-13

## 2016-03-05 NOTE — Patient Instructions (Signed)
YOU HAD AN ENDOSCOPIC PROCEDURE TODAY AT [facility]:   Refer to the procedure report that was given to you for any specific questions about what was found during the examination.  If the procedure report does not answer your questions, please call your gastroenterologist to clarify.  If you requested that your care partner not be given the details of your procedure findings, then the procedure report has been included in a sealed envelope for you to review at your convenience later.  YOU SHOULD EXPECT: Some feelings of bloating in the abdomen. Passage of more gas than usual.  Walking can help get rid of the air that was put into your GI tract during the procedure and reduce the bloating. If you had a lower endoscopy (such as a colonoscopy or flexible sigmoidoscopy) you may notice spotting of blood in your stool or on the toilet paper. If you underwent a bowel prep for your procedure, you may not have a normal bowel movement for a few days.  Please Note:  You might notice some irritation and congestion in your nose or some drainage.  This is from the oxygen used during your procedure.  There is no need for concern and it should clear up in a day or so.  SYMPTOMS TO REPORT IMMEDIATELY:   Following lower endoscopy (colonoscopy or flexible sigmoidoscopy):  Excessive amounts of blood in the stool  Significant tenderness or worsening of abdominal pains  Swelling of the abdomen that is new, acute  Fever of 100F or higher   For urgent or emergent issues, a gastroenterologist can be reached at any hour by calling [phone].   DIET:  We do recommend a small meal at first, but then you may proceed to your regular diet.  Drink plenty of fluids but you should avoid alcoholic beverages for 24 hours.  ACTIVITY:  You should plan to take it easy for the rest of today and you should NOT DRIVE or use heavy machinery until tomorrow (because of the sedation medicines used during the test).     FOLLOW UP: Our staff will call the number listed on your records the next business day following your procedure to check on you and address any questions or concerns that you may have regarding the information given to you following your procedure. If we do not reach you, we will leave a message.  However, if you are feeling well and you are not experiencing any problems, there is no need to return our call.  We will assume that you have returned to your regular daily activities without incident.  If any biopsies were taken you will be contacted by phone or by letter within the next 1-3 weeks.  Please call us at [phone] if you have not heard about the biopsies in 3 weeks.    SIGNATURES/CONFIDENTIALITY: You and/or your care partner have signed paperwork which will be entered into your electronic medical record.  These signatures attest to the fact that that the information above on your After Visit Summary has been reviewed and is understood.  Full responsibility of the confidentiality of this discharge information lies with you and/or your care-partner.  No NSAIDS: aspirin, aleve nor ibuprofen for 3 weeks.   We will schedule a flexible sigmoid for your for 3 months out.  Thank-you for choosing [place] for your healthcare needs today.

## 2016-03-05 NOTE — Progress Notes (Signed)
Patient awakening,vss,report to rn 

## 2016-03-05 NOTE — Progress Notes (Signed)
Called to room to assist during endoscopic procedure.  Patient ID and intended procedure confirmed with present staff. Received instructions for my participation in the procedure from the performing physician.  

## 2016-03-05 NOTE — Op Note (Signed)
[facility] Patient Name: Heidi Murphy Procedure Date: 03/05/2016 3:27 PM MRN: [id number] Endoscopist: Jerene Bears , MD Age: 50 Referring MD:  Date of Birth: 1966-01-09 Gender: Female Account #: [id number] Procedure:                Colonoscopy Indications:              High risk colon cancer surveillance: Personal                            history of adenoma with villous component; history                            of rectal tubulovillous adenoma of rectum removed                            with EMR piecemeal June 2014 + additional small                            adenomas in the left colon; flexible sigmoidoscopy                            Sept 2014 with 5 mm rectal polyp removed with hot                            snare and biopsies surrounding polypectomy site                            negative for adenomatous change Medicines:                Monitored Anesthesia Care Procedure:                Pre-Anesthesia Assessment:                           - Prior to the procedure, a History and Physical                            was performed, and patient medications and                            allergies were reviewed. The patient's tolerance of                            previous anesthesia was also reviewed. The risks                            and benefits of the procedure and the sedation                            options and risks were discussed with the patient.                            All questions were answered, and informed consent  was obtained. Prior Anticoagulants: The patient has                            taken no previous anticoagulant or antiplatelet                            agents. ASA Grade Assessment: II - A patient with                            mild systemic disease. After reviewing the risks                            and benefits, the patient was deemed in                            satisfactory condition to undergo the  procedure.                           After obtaining informed consent, the colonoscope                            was passed under direct vision. Throughout the                            procedure, the patient's blood pressure, pulse, and                            oxygen saturations were monitored continuously. The                            Model [id number]-[id number]) scope was introduced                            through the anus and advanced to the the cecum,                            identified by appendiceal orifice and ileocecal                            valve. The colonoscopy was performed without                            difficulty. The patient tolerated the procedure                            well. The quality of the bowel preparation was                            good. The ileocecal valve, appendiceal orifice, and                            rectum were photographed. Scope In: 3:43:17 PM Scope Out: 4:07:16 PM Scope Withdrawal Time: 0 hours 21 minutes 4 seconds  Total Procedure Duration: 0  hours 23 minutes 59 seconds  Findings:                 The digital rectal exam was normal.                           A 18 mm polyp was found in the rectum. The polyp                            was sessile. Preparations were made for mucosal                            resection. Saline (10 cc) was injected with                            adequate lift of the lesion from the muscularis                            propria. Snare mucosal resection was performed.                            Resection and retrieval were complete. There was no                            bleeding during, and at the end, of the procedure.                            Biopsied with a cold forceps were obtained from                            around the polypectomy site for histology in                            attempt to ensure no residual adenomatous change.                           Two sessile polyps were  found in the descending                            colon. The polyps were 3 to 4 mm in size. These                            polyps were removed with a cold snare (1) and cold                            forceps (1). Resection and retrieval were complete.                           A 2 mm polyp was found in the recto-sigmoid colon.                            The polyp was sessile. The polyp was removed with a  cold biopsy forceps. Resection and retrieval were                            complete.                           Internal hemorrhoids were found during                            retroflexion. The hemorrhoids were small.                           The exam was otherwise without abnormality. Complications:            No immediate complications. Estimated Blood Loss:     Estimated blood loss was minimal. Impression:               - One 18 mm polyp in the rectum, removed with                            mucosal resection. Resected and retrieved. Biopsied.                           - Two 3 to 4 mm polyps in the descending colon,                            removed with a cold snare and cold forceps.                            Resected and retrieved.                           - One 2 mm polyp at the recto-sigmoid colon,                            removed with a cold biopsy forceps. Resected and                            retrieved.                           - Internal hemorrhoids.                           - The examination was otherwise normal. Recommendation:           - Patient has a contact number available for                            emergencies. The signs and symptoms of potential                            delayed complications were discussed with the                            patient. Return to normal activities tomorrow.  Written discharge instructions were provided to the                            patient.                            - Resume previous diet.                           - Continue present medications.                           - No aspirin, ibuprofen, naproxen, or other                            non-steroidal anti-inflammatory drugs for 3 weeks                            after polyp removal.                           - Await pathology results.                           - Repeat colonoscopy is recommended for                            surveillance. The colonoscopy date will be                            determined after pathology results from today's                            exam become available for review.                           - Flexible sigmoidoscopy in 3 months. Jerene Bears, MD 03/05/2016 4:20:19 PM This report has been signed electronically.

## 2016-03-06 ENCOUNTER — Telehealth: Payer: Self-pay | Admitting: *Deleted

## 2016-03-06 NOTE — Telephone Encounter (Signed)
  Follow up Call-  Call back number 03/05/2016  Post procedure Call Back phone  # [phone]  Permission to leave phone message Yes  Some recent data might be hidden     Patient questions:  Do you have a fever, pain , or abdominal swelling? No. Pain Score  0 *  Have you tolerated food without any problems? Yes.    Have you been able to return to your normal activities? Yes.    Do you have any questions about your discharge instructions: Diet   No. Medications  No. Follow up visit  No.  Do you have questions or concerns about your Care? No.  Actions: * If pain score is 4 or above: No action needed, pain <4.

## 2016-03-12 ENCOUNTER — Telehealth: Payer: Self-pay | Admitting: Internal Medicine

## 2016-03-12 NOTE — Telephone Encounter (Signed)
See result note.  

## 2016-05-20 MED FILL — ESOMEPRAZOLE MAG DR 40 MG C: 40 | 90 days supply | Qty: 90 | Fill #2

## 2016-05-27 ENCOUNTER — Other Ambulatory Visit: Payer: Self-pay | Admitting: Emergency Medicine

## 2016-05-28 ENCOUNTER — Other Ambulatory Visit: Payer: Self-pay | Admitting: Family Medicine

## 2016-05-28 ENCOUNTER — Telehealth: Payer: Self-pay | Admitting: Family Medicine

## 2016-05-28 DIAGNOSIS — Z0000 Encounter for general adult medical examination without abnormal findings: Secondary | ICD-10-CM

## 2016-05-28 DIAGNOSIS — R5383 Other fatigue: Secondary | ICD-10-CM

## 2016-05-28 DIAGNOSIS — R0683 Snoring: Secondary | ICD-10-CM

## 2016-05-28 NOTE — Telephone Encounter (Signed)
Heidi Murphy pt returned your call she said the call maybe was about labs she is not sure.

## 2016-05-28 NOTE — Telephone Encounter (Signed)
Spoke to the pt.  Scheduled her for lab work and placed order for home sleep study.  Sent Darlina Guys at [facility] a message informing her the order has been put in.  Advised she contact me if any questions.

## 2016-05-28 NOTE — Telephone Encounter (Signed)
A referral was sent to [facility] health for this pt, but they do not do respiratory therapy.  [place] states perhaps [facility] could handle this referral. Need to resend to someone else.

## 2016-05-28 NOTE — Telephone Encounter (Signed)
Called and spoke to pulmonary.  Was instructed how to put in order.  Pulmonary will contact the pt once insurance is authorized.

## 2016-05-28 NOTE — Addendum Note (Signed)
Addended by: Miles Costain T on: 05/28/2016 10:43 AM   Modules accepted: Orders

## 2016-05-28 NOTE — Telephone Encounter (Signed)
RE: HOME SLEEP STUDY  Received: Today  Message Contents  [place], [place]        Thank you for thinking of [facility] but we do not perform these studies. You can reach out to [facility] or Dr. Fransico Him to see if they can provide this.    Typically for a cpap soc most insurance require a office visit with wording around the recommendation of a sleep study. This should be prior to the pt having the sleep study. After the sleep study is done and the pt is diagnosed with OSA an order for the cpap on an auto setting can be placed. We can set the pt up on a unit with the auto setting with a download in 2 weeks to decide optimum pressure. The pt will not require an in lab titration study this way.   Call me with any questions. [phone]  Thank you!

## 2016-05-29 ENCOUNTER — Other Ambulatory Visit: Payer: Self-pay | Admitting: Family Medicine

## 2016-05-29 NOTE — Telephone Encounter (Signed)
Noted.  Referral to pulmonary was placed on 05/28/16.

## 2016-06-01 ENCOUNTER — Other Ambulatory Visit (INDEPENDENT_AMBULATORY_CARE_PROVIDER_SITE_OTHER): Payer: 59

## 2016-06-01 DIAGNOSIS — R5383 Other fatigue: Secondary | ICD-10-CM | POA: Diagnosis not present

## 2016-06-01 DIAGNOSIS — Z0000 Encounter for general adult medical examination without abnormal findings: Secondary | ICD-10-CM | POA: Diagnosis not present

## 2016-06-01 LAB — BASIC METABOLIC PANEL
BUN: 14 mg/dL (ref 6–23)
CHLORIDE: 109 meq/L (ref 96–112)
CO2: 26 meq/L (ref 19–32)
Calcium: 8.9 mg/dL (ref 8.4–10.5)
Creatinine, Ser: 0.84 mg/dL (ref 0.40–1.20)
GFR: 76.23 mL/min (ref >60.00)
Glucose, Bld: 105 mg/dL — ABNORMAL HIGH (ref 70–99)
POTASSIUM: 4 meq/L (ref 3.5–5.1)
Sodium: 143 mEq/L (ref 135–145)

## 2016-06-01 LAB — POC URINALSYSI DIPSTICK (AUTOMATED)
Bilirubin, UA: NEGATIVE
Blood, UA: NEGATIVE
GLUCOSE UA: NEGATIVE
KETONES UA: NEGATIVE
Nitrite, UA: NEGATIVE
SPEC GRAV UA: 1.035
Urobilinogen, UA: 0.2
pH, UA: 5

## 2016-06-01 LAB — CBC WITH DIFFERENTIAL/PLATELET
Basophils Absolute: 0 10*3/uL (ref 0.0–0.1)
Basophils Relative: 0.5 % (ref 0.0–3.0)
EOS PCT: 3.3 % (ref 0.0–5.0)
Eosinophils Absolute: 0.2 10*3/uL (ref 0.0–0.7)
HEMATOCRIT: 38.1 % (ref 36.0–46.0)
Hemoglobin: 12.6 g/dL (ref 12.0–15.0)
Lymphocytes Relative: 24.9 % (ref 12.0–46.0)
Lymphs Abs: 1.6 10*3/uL (ref 0.7–4.0)
MCHC: 33 g/dL (ref 30.0–36.0)
MCV: 84 fl (ref 78.0–100.0)
Monocytes Absolute: 0.5 10*3/uL (ref 0.1–1.0)
Monocytes Relative: 7.3 % (ref 3.0–12.0)
NEUTROS ABS: 4 10*3/uL (ref 1.4–7.7)
Neutrophils Relative %: 64 % (ref 43.0–77.0)
Platelets: 265 10*3/uL (ref 150.0–400.0)
RBC: 4.53 Mil/uL (ref 3.87–5.11)
RDW: 15 % (ref 11.5–15.5)
WBC: 6.3 10*3/uL (ref 4.0–10.5)

## 2016-06-01 LAB — LIPID PANEL
CHOL/HDL RATIO: 4
Cholesterol: 202 mg/dL — ABNORMAL HIGH (ref 0–200)
HDL: 48.6 mg/dL (ref >39.00)
LDL CALC: 133 mg/dL — AB (ref 0–99)
NONHDL: 153.57
TRIGLYCERIDES: 103 mg/dL (ref 0.0–149.0)
VLDL: 20.6 mg/dL (ref 0.0–40.0)

## 2016-06-01 LAB — HEPATIC FUNCTION PANEL
ALBUMIN: 3.8 g/dL (ref 3.5–5.2)
ALT: 21 U/L (ref 0–35)
AST: 17 U/L (ref 0–37)
Alkaline Phosphatase: 69 U/L (ref 39–117)
BILIRUBIN DIRECT: 0.1 mg/dL (ref 0.0–0.3)
TOTAL PROTEIN: 6.4 g/dL (ref 6.0–8.3)
Total Bilirubin: 0.4 mg/dL (ref 0.2–1.2)

## 2016-06-02 LAB — TSH: TSH: 3.51 u[IU]/mL (ref 0.35–4.50)

## 2016-06-02 LAB — VITAMIN B12: VITAMIN B 12: 282 pg/mL (ref 211–911)

## 2016-06-08 ENCOUNTER — Telehealth: Payer: Self-pay | Admitting: Family Medicine

## 2016-06-08 NOTE — Telephone Encounter (Signed)
-----   Message from Elmer Picker sent at 06/08/2016  4:25 PM EDT ----- I'm sorry my dear. It's too late to add it on.   ----- Message ----- From: Hulda Humphrey, CMA Sent: 06/08/2016   2:04 PM To: [facility]  Can you add an A1C?  Please let me know.

## 2016-06-09 ENCOUNTER — Other Ambulatory Visit: Payer: Self-pay | Admitting: Family Medicine

## 2016-06-09 DIAGNOSIS — R739 Hyperglycemia, unspecified: Secondary | ICD-10-CM

## 2016-06-09 DIAGNOSIS — R5383 Other fatigue: Secondary | ICD-10-CM

## 2016-06-09 DIAGNOSIS — R0683 Snoring: Secondary | ICD-10-CM

## 2016-06-09 NOTE — Telephone Encounter (Signed)
Pt scheduled for A1C on 06/10/16

## 2016-06-10 ENCOUNTER — Other Ambulatory Visit (INDEPENDENT_AMBULATORY_CARE_PROVIDER_SITE_OTHER): Payer: 59

## 2016-06-10 DIAGNOSIS — R739 Hyperglycemia, unspecified: Secondary | ICD-10-CM

## 2016-06-10 LAB — HEMOGLOBIN A1C: Hgb A1c MFr Bld: 6.2 % (ref 4.6–6.5)

## 2016-06-12 ENCOUNTER — Telehealth: Payer: Self-pay | Admitting: Internal Medicine

## 2016-06-12 NOTE — Telephone Encounter (Signed)
Spoke with Hilda Blades at [facility], aware of appt time.  Nothing further needed at this time.

## 2016-06-12 NOTE — Telephone Encounter (Signed)
CY  Please Advise-  Debra from Dr. Honor Junes office called and stated Dr. Sherren Mocha wanted to know if we could get the pt in the office sooner than her appointment that she has with you for a sleep consult on 07/23/16. I have checked your schedule and other MD schedules and no other openings.

## 2016-06-12 NOTE — Telephone Encounter (Signed)
Heidi Murphy- do you see any earlier opportunity on the schedule?

## 2016-06-12 NOTE — Telephone Encounter (Signed)
Friday 07-03-16 at 11:30am slot can be used. Thanks.

## 2016-06-16 ENCOUNTER — Other Ambulatory Visit: Payer: Self-pay | Admitting: Family Medicine

## 2016-06-16 DIAGNOSIS — R739 Hyperglycemia, unspecified: Secondary | ICD-10-CM

## 2016-06-23 ENCOUNTER — Telehealth: Payer: Self-pay | Admitting: Internal Medicine

## 2016-06-24 NOTE — Telephone Encounter (Signed)
Pt can be seen 07-08-16 at 11:30am slot for consult. Thanks .

## 2016-06-24 NOTE — Telephone Encounter (Signed)
LMTCB

## 2016-06-24 NOTE — Telephone Encounter (Signed)
Lm x 1 

## 2016-06-25 NOTE — Telephone Encounter (Signed)
Pt's appt scheduled for 4/18.  Will close encounter.

## 2016-07-03 ENCOUNTER — Institutional Professional Consult (permissible substitution): Payer: 59 | Admitting: Internal Medicine

## 2016-07-08 ENCOUNTER — Encounter: Payer: Self-pay | Admitting: Internal Medicine

## 2016-07-08 ENCOUNTER — Ambulatory Visit (INDEPENDENT_AMBULATORY_CARE_PROVIDER_SITE_OTHER): Payer: 59 | Admitting: Internal Medicine

## 2016-07-08 VITALS — BP 120/82 | HR 71 | Ht 62.0 in | Wt 198.0 lb

## 2016-07-08 DIAGNOSIS — R5383 Other fatigue: Secondary | ICD-10-CM | POA: Diagnosis not present

## 2016-07-08 DIAGNOSIS — G4733 Obstructive sleep apnea (adult) (pediatric): Secondary | ICD-10-CM | POA: Diagnosis not present

## 2016-07-08 DIAGNOSIS — R0683 Snoring: Secondary | ICD-10-CM | POA: Insufficient documentation

## 2016-07-08 NOTE — Assessment & Plan Note (Signed)
She doesn't really describe feeling that she needs to take a nap, but sleep latency is short at bedtime. Epworth score 4/24 this point to severe daytime sleepiness. She may not be describing sleepiness. Often overlapping descriptions apply to depression, stress, weakness. Thyroid function tested normal. Plan-await results of sleep study.

## 2016-07-08 NOTE — Progress Notes (Signed)
07/08/16-51 year old female never smoker referred courtesy of Dr. Sherren Mocha for sleep evaluation. Medical problems include allergic rhinitis, GERD Pt. states she has "foggy thinking", wants a HST to rule out sleep apnea.  Her husband, a physician, has told her she snores loudly and has witnessed apneas. Father has probable OSA. One cup of coffee in the morning. Rare naps. No ENT surgery, lung, heart or thyroid problems and no history of brain injury. She is not bothered by limb movement and denies complex behaviors such as sleepwalking. Epworth Score 4/24  Prior to Admission medications   Medication Sig Start Date End Date Taking? Authorizing Provider  esomeprazole (NEXIUM) 40 MG capsule TAKE 1 CAPSULE BY MOUTH DAILY BEFORE BREAKFAST. 11/26/15  Yes Dorena Cookey, MD   Past Medical History:  Diagnosis Date  . GERD (gastroesophageal reflux disease)    Past Surgical History:  Procedure Laterality Date  . CESAREAN SECTION  2001 AND 2003  . COLONOSCOPY    . FLEXIBLE SIGMOIDOSCOPY N/A 12/13/2012   Procedure: FLEXIBLE SIGMOIDOSCOPY;  Surgeon: Jerene Bears, MD;  Location: [facility];  Service: Gastroenterology;  Laterality: N/A;  . HOT HEMOSTASIS N/A 12/13/2012   Procedure: HOT HEMOSTASIS (ARGON PLASMA COAGULATION/BICAP);  Surgeon: Jerene Bears, MD;  Location: [facility];  Service: Gastroenterology;  Laterality: N/A;   Family History  Problem Relation Age of Onset  . Prostate cancer Father   . Colon polyps Father   . GER disease Mother     GERD  . Colon cancer Neg Hx   . Esophageal cancer Neg Hx   . Stomach cancer Neg Hx   . Rectal cancer Neg Hx    Social History   Social History  . Marital status: Married    Spouse name: N/A  . Number of children: 2  . Years of education: N/A   Occupational History  . Dietitian   Social History Main Topics  . Smoking status: Never Smoker  . Smokeless tobacco: Never Used  . Alcohol use Yes     Comment: occasional  .  Drug use: No  . Sexual activity: Not on file   Other Topics Concern  . Not on file   Social History Narrative  . No narrative on file   ROS-see HPI   Negative unless "+" Constitutional:    weight loss, night sweats, fevers, chills, fatigue, lassitude. HEENT:    headaches, difficulty swallowing, tooth/dental problems, sore throat,       sneezing, itching, ear ache, nasal congestion, post nasal drip, snoring CV:    chest pain, orthopnea, PND, swelling in lower extremities, anasarca,                                  dizziness, palpitations Resp:   shortness of breath with exertion or at rest.                productive cough,   non-productive cough, coughing up of blood.              change in color of mucus.  wheezing.   Skin:    rash or lesions. GI:  No-   heartburn, indigestion, abdominal pain, nausea, vomiting, diarrhea,                 change in bowel habits, loss of appetite GU: dysuria, change in color of urine, no urgency or frequency.   flank pain. MS:  joint pain, stiffness, decreased range of motion, back pain. Neuro-     nothing unusual Psych:  change in mood or affect.  depression or anxiety.   memory loss.  OBJ- Physical Exam General- Alert, Oriented, Affect-appropriate, pleasant intelligent woman, Distress- none acute, + overweight Skin- rash-none, lesions- none, excoriation- none Lymphadenopathy- none Head- atraumatic            Eyes- Gross vision intact, PERRLA, conjunctivae and secretions clear            Ears- Hearing, canals-normal            Nose- Clear, no-Septal dev, mucus, polyps, erosion, perforation             Throat- Mallampati IV , mucosa clear , drainage- none, tonsils- atrophic Neck- flexible , trachea midline, no stridor , thyroid nl, carotid no bruit Chest - symmetrical excursion , unlabored           Heart/CV- RRR , no murmur , no gallop  , no rub, nl s1 s2                           - JVD- none , edema- none, stasis changes- none, varices- none            Lung- clear to P&A, wheeze- none, cough- none , dullness-none, rub- none           Chest wall-  Abd-  Br/ Gen/ Rectal- Not done, not indicated Extrem- cyanosis- none, clubbing, none, atrophy- none, strength- nl Neuro- grossly intact to observation

## 2016-07-08 NOTE — Assessment & Plan Note (Signed)
Tentative diagnosis, high probability based on exam and history Plan-education done. Schedule sleep study

## 2016-07-08 NOTE — Patient Instructions (Signed)
Order- Schedule unattended home sleep test     Dx OSA  My nurse will work with you to set up a return to go over results.

## 2016-07-23 ENCOUNTER — Institutional Professional Consult (permissible substitution): Payer: 59 | Admitting: Internal Medicine

## 2016-07-29 DIAGNOSIS — G4733 Obstructive sleep apnea (adult) (pediatric): Secondary | ICD-10-CM | POA: Diagnosis not present

## 2016-08-05 DIAGNOSIS — G4733 Obstructive sleep apnea (adult) (pediatric): Secondary | ICD-10-CM | POA: Diagnosis not present

## 2016-08-06 ENCOUNTER — Other Ambulatory Visit: Payer: Self-pay | Admitting: *Deleted

## 2016-08-06 DIAGNOSIS — G4733 Obstructive sleep apnea (adult) (pediatric): Secondary | ICD-10-CM

## 2016-08-10 ENCOUNTER — Encounter: Payer: Self-pay | Admitting: Internal Medicine

## 2016-08-13 ENCOUNTER — Encounter: Payer: Self-pay | Admitting: Internal Medicine

## 2016-08-13 ENCOUNTER — Ambulatory Visit (INDEPENDENT_AMBULATORY_CARE_PROVIDER_SITE_OTHER): Payer: 59 | Admitting: Internal Medicine

## 2016-08-13 DIAGNOSIS — R0683 Snoring: Secondary | ICD-10-CM

## 2016-08-13 NOTE — Patient Instructions (Addendum)
We suggested chin straps, mouth pieces, sleeping off flat of back, and weight loss as ways to reduce the snoring.  A nap can be a good idea at times. You can also try otc caffeine tablets  You can also talk with Dr Sherren Mocha about whether a non-sedating antidepressant might help with the "fog"  Please call if we can help

## 2016-08-13 NOTE — Progress Notes (Signed)
07/08/16-51 year old female never smoker referred courtesy of Dr. Sherren Mocha for sleep evaluation. Medical problems include allergic rhinitis, GERD Pt. states she has "foggy thinking", wants a HST to rule out sleep apnea.  Her husband, a physician, has told her she snores loudly and has witnessed apneas. Father has probable OSA. One cup of coffee in the morning. Rare naps. No ENT surgery, lung, heart or thyroid problems and no history of brain injury. She is not bothered by limb movement and denies complex behaviors such as sleepwalking. Epworth Score 4/24  08/13/16- 51 year old female never smoker followed for sleep problems Unattended Home Sleep Test 07/29/16-AHI 5.0/hour, desaturation to 81% with average 93%, body weight 198 pounds RESULTS patient is here to go over her sleep study  She did have some snoring and recorded a few minutes with oxygen saturation less than a 90%, consistent with the events her husband notices. She does not meet medical criteria for a diagnosis of obstructive sleep apnea with enough respiratory sleep disturbance to be of medical concern. An unattended sleep study does not record EEG for detailed assessment of sleep stages and sleep efficiency but I don't have evidence of enough sleep disturbance to explain her complaint of "foggy thinking".  ROS-see HPI   Negative unless "+" Constitutional:    weight loss, night sweats, fevers, chills, fatigue, lassitude. HEENT:    headaches, difficulty swallowing, tooth/dental problems, sore throat,       sneezing, itching, ear ache, nasal congestion, post nasal drip, snoring CV:    chest pain, orthopnea, PND, swelling in lower extremities, anasarca,                                                 dizziness, palpitations Resp:   shortness of breath with exertion or at rest.                productive cough,   non-productive cough, coughing up of blood.              change in color of mucus.  wheezing.   Skin:    rash or lesions. GI:  No-    heartburn, indigestion, abdominal pain, nausea, vomiting, diarrhea,                 change in bowel habits, loss of appetite GU: dysuria, change in color of urine, no urgency or frequency.   flank pain. MS:   joint pain, stiffness, decreased range of motion, back pain. Neuro-     nothing unusual-see history of present illness Psych:  change in mood or affect.  depression or anxiety.   memory loss.  OBJ- Physical Exam General- Alert, Oriented, Affect-appropriate, pleasant intelligent woman, Distress- none acute, + overweight Skin- rash-none, lesions- none, excoriation- none Lymphadenopathy- none Head- atraumatic            Eyes- Gross vision intact, PERRLA, conjunctivae and secretions clear            Ears- Hearing, canals-normal            Nose- Clear, no-Septal dev, mucus, polyps, erosion, perforation             Throat- Mallampati IV , mucosa clear , drainage- none, tonsils- atrophic Neck- flexible , trachea midline, no stridor , thyroid nl, carotid no bruit Chest - symmetrical excursion , unlabored  Heart/CV- RRR , no murmur , no gallop  , no rub, nl s1 s2                           - JVD- none , edema- none, stasis changes- none, varices- none           Lung- clear to P&A, wheeze- none, cough- none , dullness-none, rub- none           Chest wall-  Abd-  Br/ Gen/ Rectal- Not done, not indicated Extrem- cyanosis- none, clubbing, none, atrophy- none, strength- nl Neuro- grossly intact to observation

## 2016-08-14 ENCOUNTER — Encounter: Payer: Self-pay | Admitting: Internal Medicine

## 2016-08-16 NOTE — Assessment & Plan Note (Addendum)
If concerns continue, it may be appropriate to do a full sleep center NPSG at sometime in the future. For now I discussed good sleep hygiene and conservative measures for managing snoring such as chin strap, sleeping off flat of back, oral appliance. Weight loss would help. We discussed trial of caffeine tablets or nonsedating antidepressant.

## 2016-08-21 MED FILL — ESOMEPRAZOLE MAG DR 40 MG C: 40 | 90 days supply | Qty: 90 | Fill #3

## 2016-10-29 ENCOUNTER — Encounter: Payer: Self-pay | Admitting: Internal Medicine

## 2016-11-13 ENCOUNTER — Other Ambulatory Visit: Payer: Self-pay | Admitting: Family Medicine

## 2016-11-13 NOTE — Telephone Encounter (Signed)
Please advise on how you would like to proceed. This pt last ov was 2013 but it looks like a refill was called in 11/26/15 for a QTY#90 RF#3

## 2016-11-16 MED FILL — ESOMEPRAZOLE MAG DR 40 MG C: 40 | 90 days supply | Qty: 90 | Fill #0

## 2016-11-16 NOTE — Telephone Encounter (Signed)
Approved by physician. Will close message.

## 2016-11-20 DIAGNOSIS — N951 Menopausal and female climacteric states: Secondary | ICD-10-CM | POA: Diagnosis not present

## 2016-11-20 DIAGNOSIS — Z131 Encounter for screening for diabetes mellitus: Secondary | ICD-10-CM | POA: Diagnosis not present

## 2016-11-20 DIAGNOSIS — Z01419 Encounter for gynecological examination (general) (routine) without abnormal findings: Secondary | ICD-10-CM | POA: Diagnosis not present

## 2016-11-20 DIAGNOSIS — Z1231 Encounter for screening mammogram for malignant neoplasm of breast: Secondary | ICD-10-CM | POA: Diagnosis not present

## 2016-11-20 DIAGNOSIS — Z6837 Body mass index (BMI) 37.0-37.9, adult: Secondary | ICD-10-CM | POA: Diagnosis not present

## 2016-11-26 DIAGNOSIS — R7309 Other abnormal glucose: Secondary | ICD-10-CM | POA: Diagnosis not present

## 2016-11-27 DIAGNOSIS — I788 Other diseases of capillaries: Secondary | ICD-10-CM | POA: Diagnosis not present

## 2016-11-27 DIAGNOSIS — L814 Other melanin hyperpigmentation: Secondary | ICD-10-CM | POA: Diagnosis not present

## 2016-11-27 DIAGNOSIS — L570 Actinic keratosis: Secondary | ICD-10-CM | POA: Diagnosis not present

## 2016-11-27 MED FILL — FLUOROURACIL 5% CREAM: 5 | 14 days supply | Qty: 40 | Fill #0

## 2016-11-30 ENCOUNTER — Ambulatory Visit (AMBULATORY_SURGERY_CENTER): Payer: Self-pay | Admitting: *Deleted

## 2016-11-30 VITALS — Ht 62.0 in | Wt 208.0 lb

## 2016-11-30 DIAGNOSIS — Z86010 Personal history of colonic polyps: Secondary | ICD-10-CM

## 2016-11-30 NOTE — Progress Notes (Signed)
No egg or soy allergy known to patient  No issues with past sedation with any surgeries  or procedures, no intubation problems  No diet pills per patient No home 02 use per patient  No blood thinners per patient  Pt denies issues with constipation  No A fib or A flutter  EMMI video sent to pt's e mail -pt declined  Pt was surprised to hear we sedate for the flex. She said she was told no sedation for a flex. Explained to her we do sedate for our flex sig's but she can decide no sedation however she HAS to have an IV.  She verbalized understanding and states she wants to do what we normally do she was just surprised.

## 2016-12-03 ENCOUNTER — Encounter: Payer: Self-pay | Admitting: Internal Medicine

## 2016-12-10 ENCOUNTER — Encounter: Payer: Self-pay | Admitting: Internal Medicine

## 2016-12-10 ENCOUNTER — Ambulatory Visit (AMBULATORY_SURGERY_CENTER): Payer: 59 | Admitting: Internal Medicine

## 2016-12-10 VITALS — BP 111/65 | HR 66 | Temp 98.4°F | Resp 16 | Ht 62.0 in | Wt 208.0 lb

## 2016-12-10 DIAGNOSIS — K219 Gastro-esophageal reflux disease without esophagitis: Secondary | ICD-10-CM | POA: Diagnosis not present

## 2016-12-10 DIAGNOSIS — K6289 Other specified diseases of anus and rectum: Secondary | ICD-10-CM | POA: Diagnosis not present

## 2016-12-10 DIAGNOSIS — Z86010 Personal history of colonic polyps: Secondary | ICD-10-CM

## 2016-12-10 DIAGNOSIS — G4733 Obstructive sleep apnea (adult) (pediatric): Secondary | ICD-10-CM | POA: Diagnosis not present

## 2016-12-10 MED ORDER — SODIUM CHLORIDE 0.9 % IV SOLN: 500.0000 mL | INTRAVENOUS | Status: AC

## 2016-12-10 NOTE — Progress Notes (Signed)
Called to room to assist during endoscopic procedure.  Patient ID and intended procedure confirmed with present staff. Received instructions for my participation in the procedure from the performing physician.  

## 2016-12-10 NOTE — Progress Notes (Signed)
Pt's states no medical or surgical changes since previsit or office visit. 

## 2016-12-10 NOTE — Progress Notes (Signed)
Report to PACU, RN, vss, BBS= Clear.  

## 2016-12-10 NOTE — Op Note (Signed)
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 Medicines:                Monitored Anesthesia Care Procedure:                Pre-Anesthesia Assessment:                           - Prior to the procedure, a History and Physical                            was performed, and patient medications and                            allergies were reviewed. The patient's tolerance of                            previous anesthesia was also reviewed. The risks                            and benefits of the procedure and the sedation                            options and risks were discussed with the patient.                            All questions were answered, and informed consent                            was obtained. Prior Anticoagulants: The patient has                            taken no previous anticoagulant or antiplatelet                            agents. ASA Grade Assessment: II - A patient with  mild systemic disease. After reviewing the risks                            and benefits, the patient was deemed in                            satisfactory condition to undergo the procedure.                           After obtaining informed consent, the scope was                            passed under direct vision. The Colonoscope was                            introduced through the anus and advanced to  the the                            sigmoid colon. The flexible sigmoidoscopy was                            accomplished without difficulty. The patient                            tolerated the procedure well. The quality of the                            bowel preparation was excellent. Scope In: 2:18:15 PM Scope Out: 2:25:52 PM Total Procedure Duration: 0 hours 7 minutes 37 seconds  Findings:                 The digital rectal exam was normal.                           A 15 mm post polypectomy scar was found in the                            distal rectum. There was no evidence of the                            previous polyp. This area was biopsied with a cold                            forceps for histology to exclude residual adenoma.                           The exam was otherwise without abnormality. Complications:            No immediate complications. Estimated Blood Loss:     Estimated blood loss was minimal. Impression:               - Post-polypectomy scar in the distal rectum.                            Biopsied.                           -  The examination was otherwise normal. Recommendation:           - Patient has a contact number available for                            emergencies. The signs and symptoms of potential                            delayed complications were discussed with the                            patient. Return to normal activities tomorrow.                            Written discharge instructions were provided to the                            patient.                           - Resume previous diet.                           - Await pathology results.                           - Repeat colonoscopy interval to be based on                            pathology results. Jerene Bears, MD 12/10/2016 2:34:57 PM This report has been signed electronically.

## 2016-12-10 NOTE — Patient Instructions (Signed)
Discharge instructions given. Biopsies taken. Resume previous medications. YOU HAD AN ENDOSCOPIC PROCEDURE TODAY AT THE [facility]:   Refer to the procedure report that was given to you for any specific questions about what was found during the examination.  If the procedure report does not answer your questions, please call your gastroenterologist to clarify.  If you requested that your care partner not be given the details of your procedure findings, then the procedure report has been included in a sealed envelope for you to review at your convenience later.  YOU SHOULD EXPECT: Some feelings of bloating in the abdomen. Passage of more gas than usual.  Walking can help get rid of the air that was put into your GI tract during the procedure and reduce the bloating. If you had a lower endoscopy (such as a colonoscopy or flexible sigmoidoscopy) you may notice spotting of blood in your stool or on the toilet paper. If you underwent a bowel prep for your procedure, you may not have a normal bowel movement for a few days.  Please Note:  You might notice some irritation and congestion in your nose or some drainage.  This is from the oxygen used during your procedure.  There is no need for concern and it should clear up in a day or so.  SYMPTOMS TO REPORT IMMEDIATELY:   Following lower endoscopy (colonoscopy or flexible sigmoidoscopy):  Excessive amounts of blood in the stool  Significant tenderness or worsening of abdominal pains  Swelling of the abdomen that is new, acute  Fever of 100F or higher   For urgent or emergent issues, a gastroenterologist can be reached at any hour by calling [phone].   DIET:  We do recommend a small meal at first, but then you may proceed to your regular diet.  Drink plenty of fluids but you should avoid alcoholic beverages for 24 hours.  ACTIVITY:  You should plan to take it easy for the rest of today and you should NOT DRIVE or use heavy  machinery until tomorrow (because of the sedation medicines used during the test).    FOLLOW UP: Our staff will call the number listed on your records the next business day following your procedure to check on you and address any questions or concerns that you may have regarding the information given to you following your procedure. If we do not reach you, we will leave a message.  However, if you are feeling well and you are not experiencing any problems, there is no need to return our call.  We will assume that you have returned to your regular daily activities without incident.  If any biopsies were taken you will be contacted by phone or by letter within the next 1-3 weeks.  Please call us at [phone] if you have not heard about the biopsies in 3 weeks.    SIGNATURES/CONFIDENTIALITY: You and/or your care partner have signed paperwork which will be entered into your electronic medical record.  These signatures attest to the fact that that the information above on your After Visit Summary has been reviewed and is understood.  Full responsibility of the confidentiality of this discharge information lies with you and/or your care-partner. 

## 2016-12-11 ENCOUNTER — Encounter: Payer: Self-pay | Admitting: Family Medicine

## 2016-12-11 ENCOUNTER — Telehealth: Payer: Self-pay | Admitting: *Deleted

## 2016-12-11 NOTE — Telephone Encounter (Signed)
  Follow up Call-  Call back number 12/10/2016 03/05/2016  Post procedure Call Back phone  # [phone]  Permission to leave phone message Yes Yes  Some recent data might be hidden    [facility]

## 2016-12-11 NOTE — Telephone Encounter (Signed)
No answer for post procedure follow up call. Will attempt to call back later this afternoon. Sm

## 2016-12-14 ENCOUNTER — Encounter: Payer: Self-pay | Admitting: Internal Medicine

## 2016-12-28 ENCOUNTER — Ambulatory Visit (INDEPENDENT_AMBULATORY_CARE_PROVIDER_SITE_OTHER): Payer: 59 | Admitting: *Deleted

## 2016-12-28 DIAGNOSIS — Z23 Encounter for immunization: Secondary | ICD-10-CM

## 2017-03-29 ENCOUNTER — Other Ambulatory Visit: Payer: Self-pay | Admitting: Family Medicine

## 2017-03-29 MED ORDER — ESOMEPRAZOLE MAGNESIUM 40 MG PO CPDR: DELAYED_RELEASE_CAPSULE | ORAL | 4 refills | Status: AC

## 2017-05-12 ENCOUNTER — Other Ambulatory Visit: Payer: Self-pay | Admitting: Family Medicine

## 2017-05-12 MED ORDER — HYDROCODONE-HOMATROPINE 5-1.5 MG/5ML PO SYRP: 5.0000 mL | ORAL_SOLUTION | Freq: Three times a day (TID) | ORAL | 0 refills | Status: AC | PRN

## 2017-05-18 ENCOUNTER — Encounter: Payer: Self-pay | Admitting: Family Medicine

## 2017-05-18 ENCOUNTER — Ambulatory Visit: Payer: BLUE CROSS/BLUE SHIELD | Admitting: Family Medicine

## 2017-05-18 VITALS — BP 120/88 | HR 84 | Temp 97.8°F

## 2017-05-18 DIAGNOSIS — J181 Lobar pneumonia, unspecified organism: Secondary | ICD-10-CM

## 2017-05-18 DIAGNOSIS — J189 Pneumonia, unspecified organism: Secondary | ICD-10-CM | POA: Insufficient documentation

## 2017-05-18 DIAGNOSIS — J4520 Mild intermittent asthma, uncomplicated: Secondary | ICD-10-CM

## 2017-05-18 MED ORDER — AZITHROMYCIN 250 MG PO TABS: ORAL_TABLET | ORAL | 1 refills | Status: AC

## 2017-05-18 MED ORDER — PREDNISONE 20 MG PO TABS: ORAL_TABLET | ORAL | 1 refills | Status: DC

## 2017-05-18 NOTE — Patient Instructions (Signed)
Drink lots of water  Azithromycin.........Marland Kitchen 1 daily for 10 days  Hydromet..........Marland Kitchen 1/2-1 teaspoon 3 times daily when necessary for cough  Prednisone 20 mg.......... 2 tabs daily 3 days ..........Marland Kitchen begin to taper if you feel all whole lot better if not stay on 2 tablets daily  Return next Monday for follow-up

## 2017-05-18 NOTE — Progress Notes (Signed)
Heidi Murphy is a 52 year old married female nonsmoker who comes in today with a 2 week plus history of head congestion sore throat headache and cough that won't go away  She's actually had 2 viral infections this winter.  Review of systems otherwise negative  BP 120/88 (BP Location: Left Arm, Patient Position: Sitting, Cuff Size: Normal)   Pulse 84   Temp 97.8 F (36.6 C) (Oral)   SpO2 96%  Well-developed well-nourished female no acute distress vital signs stable she's afebrile HEENT were negative neck was supple no adenopathy pulmonary exam shows symmetrical wheezing mild also crackles right base consistent with a right lower lobe pneumonia  Impression #1 viral syndrome  #2 right lower lobe pneumonia  #3 secondary asthma  Plan.......... Hydration............Marland Kitchen oral antibiotics.......Marland Kitchen prednisone.

## 2017-05-19 ENCOUNTER — Other Ambulatory Visit: Payer: Self-pay | Admitting: Family Medicine

## 2017-05-19 ENCOUNTER — Telehealth: Payer: Self-pay | Admitting: Family Medicine

## 2017-05-19 MED ORDER — ALBUTEROL SULFATE (2.5 MG/3ML) 0.083% IN NEBU: 2.5000 mg | INHALATION_SOLUTION | Freq: Four times a day (QID) | RESPIRATORY_TRACT | 3 refills | Status: AC | PRN

## 2017-05-19 NOTE — Telephone Encounter (Signed)
Copied from [place]. Topic: Quick Communication - See Telephone Encounter >> May 19, 2017 11:52 AM Bea Graff, NT wrote: CRM for notification. See Telephone encounter for: [facility] calling and states that the  albuterol (PROVENTIL) (2.5 MG/3ML) 0.083% nebulizer solution comes in a quantity of 90. They want to see if its ok to provide pt with 2 boxes that would be a quantity of 180. CB#: [phone].  05/19/17.

## 2017-05-20 NOTE — Telephone Encounter (Signed)
Spoke with pharmacy okeyed them to dispense the quantity of 180 since a box comes with 30 vials.

## 2017-05-24 ENCOUNTER — Encounter: Payer: Self-pay | Admitting: Family Medicine

## 2017-05-24 ENCOUNTER — Ambulatory Visit: Payer: BLUE CROSS/BLUE SHIELD | Admitting: Family Medicine

## 2017-05-24 VITALS — Temp 98.0°F

## 2017-05-24 DIAGNOSIS — J4520 Mild intermittent asthma, uncomplicated: Secondary | ICD-10-CM

## 2017-05-24 DIAGNOSIS — J181 Lobar pneumonia, unspecified organism: Secondary | ICD-10-CM

## 2017-05-24 DIAGNOSIS — J189 Pneumonia, unspecified organism: Secondary | ICD-10-CM

## 2017-05-24 NOTE — Patient Instructions (Signed)
Finished the oral antibiotics Prednisone 20 mg............ one for 3 days a half for 3 days then stop if you're well. If not skip Saturday Sunday and start next week and do a half a tab Monday Wednesday Friday for 2 weeks  Return when necessary

## 2017-05-24 NOTE — Progress Notes (Signed)
Heidi Murphy is a 52 year old married female nonsmoker who comes in today for follow-up of pneumonia  We saw her last week with a viral infection. She had symptoms of viral infection for 2 weeks until the sudden her cough got worse. Physical exam showed crackles right base. She was also wheezing. We started on prednisone which she took 20 mg daily until yesterday. She also started on Zithromax and One-A-Day she has 3 doses left.  She says she feels much better. Still coughing but no discolored sputum still has a slight headache.  No side effects from medication  Temp 98 F (36.7 C) (Oral)  Well-developed well-nourished female no acute distress vital signs stable she's afebrile examination lungs shows persistent crackles right brace wheezing is just minimal with late expiration bilaterally.  #1 right lower lobe pneumonia........Marland Kitchen Resolving..............Marland Kitchen finished antibiotics  #2 asthma secondary to viral syndrome......Marland Kitchen prednisone 20 mg daily for 3 days and 10 mg for 3 days.

## 2017-12-14 ENCOUNTER — Other Ambulatory Visit: Payer: Self-pay | Admitting: Family Medicine

## 2017-12-14 MED ORDER — CEFUROXIME AXETIL 250 MG PO TABS: 250.0000 mg | ORAL_TABLET | Freq: Two times a day (BID) | ORAL | 1 refills | Status: AC
